# Patient Record
Sex: Male | Born: 1955 | Race: Black or African American | Hispanic: No | Marital: Married | Smoking: Never smoker
Health system: Southern US, Community
[De-identification: ages and names within clinical notes are randomized; demographics above are authoritative.]

## PROBLEM LIST (undated history)

## (undated) DIAGNOSIS — K219 Gastro-esophageal reflux disease without esophagitis: Secondary | ICD-10-CM

## (undated) DIAGNOSIS — K529 Noninfective gastroenteritis and colitis, unspecified: Secondary | ICD-10-CM

## (undated) DIAGNOSIS — K259 Gastric ulcer, unspecified as acute or chronic, without hemorrhage or perforation: Secondary | ICD-10-CM

## (undated) DIAGNOSIS — K648 Other hemorrhoids: Secondary | ICD-10-CM

## (undated) DIAGNOSIS — K269 Duodenal ulcer, unspecified as acute or chronic, without hemorrhage or perforation: Secondary | ICD-10-CM

## (undated) DIAGNOSIS — I10 Essential (primary) hypertension: Secondary | ICD-10-CM

## (undated) HISTORY — DX: Other hemorrhoids: K64.8

## (undated) HISTORY — DX: Duodenal ulcer, unspecified as acute or chronic, without hemorrhage or perforation: K26.9

## (undated) HISTORY — DX: Gastro-esophageal reflux disease without esophagitis: K21.9

## (undated) HISTORY — DX: Gastric ulcer, unspecified as acute or chronic, without hemorrhage or perforation: K25.9

## (undated) HISTORY — DX: Noninfective gastroenteritis and colitis, unspecified: K52.9

## (undated) HISTORY — PX: EYE SURGERY: SHX253

---

## 2012-10-19 DIAGNOSIS — I10 Essential (primary) hypertension: Secondary | ICD-10-CM | POA: Diagnosis present

## 2019-01-05 ENCOUNTER — Emergency Department (HOSPITAL_BASED_OUTPATIENT_CLINIC_OR_DEPARTMENT_OTHER)
Admission: EM | Admit: 2019-01-05 | Discharge: 2019-01-05 | Disposition: A | Discharge status: Home / Self Care | Payer: PRIVATE HEALTH INSURANCE | Attending: Emergency Medicine | Admitting: Emergency Medicine

## 2019-01-05 ENCOUNTER — Other Ambulatory Visit: Payer: Self-pay

## 2019-01-05 ENCOUNTER — Encounter (HOSPITAL_BASED_OUTPATIENT_CLINIC_OR_DEPARTMENT_OTHER): Payer: Self-pay

## 2019-01-05 ENCOUNTER — Emergency Department (HOSPITAL_BASED_OUTPATIENT_CLINIC_OR_DEPARTMENT_OTHER): Payer: PRIVATE HEALTH INSURANCE

## 2019-01-05 DIAGNOSIS — R0789 Other chest pain: Secondary | ICD-10-CM | POA: Insufficient documentation

## 2019-01-05 DIAGNOSIS — I10 Essential (primary) hypertension: Secondary | ICD-10-CM | POA: Diagnosis not present

## 2019-01-05 DIAGNOSIS — R05 Cough: Secondary | ICD-10-CM | POA: Diagnosis not present

## 2019-01-05 DIAGNOSIS — Z20828 Contact with and (suspected) exposure to other viral communicable diseases: Secondary | ICD-10-CM | POA: Insufficient documentation

## 2019-01-05 HISTORY — DX: Essential (primary) hypertension: I10

## 2019-01-05 NOTE — Discharge Instructions (Signed)
Take 400mg  of ibuprofen every 8hours for the next 3 days  Please call your primary care physician for follow-up

## 2019-01-05 NOTE — ED Triage Notes (Signed)
C/o right side CP and nonprod cough x 4 week-NAD-steady gait

## 2019-01-05 NOTE — ED Provider Notes (Signed)
[facility] EMERGENCY DEPARTMENT Provider Note   CSN: [id number] Arrival date & time: 01/05/19  1208     History   Chief Complaint Chief Complaint  Patient presents with  . Chest Pain    HPI Barbara CowerMorris Comins is a 63 y.o. male.     HPI 63 year old male presents to the emergency department for right-sided chest discomfort nonproductive cough for the last 4 days without significant shortness of breath.  No fevers or chills.  No known sick contacts.  No known exposure with COVID-19 or patients under investigation for COVID-19.  Denies unilateral leg swelling.  No pleuritic nature to his pain.  Denies lower extremity swelling.  No recent travel or surgery.  Pain is constant and worse with movement.  Does not remember any trauma injury or lifting which could have exacerbated his pain   Past Medical History:  Diagnosis Date  . Hypertension     There are no active problems to display for this patient.   Past Surgical History:  Procedure Laterality Date  . EYE SURGERY          Home Medications    Prior to Admission medications   Not on File    Family History No family history on file.  Social History Social History   Tobacco Use  . Smoking status: Never Smoker  . Smokeless tobacco: Never Used  Substance Use Topics  . Alcohol use: Never    Frequency: Never  . Drug use: Never     Allergies   Patient has no known allergies.   Review of Systems Review of Systems  All other systems reviewed and are negative.    Physical Exam Updated Vital Signs BP (!) 146/114 (BP Location: Right Arm)   Pulse 73   Temp 98.3 F (36.8 C) (Oral)   Resp 18   Ht 5\' 9"  (1.753 m)   Wt 81.2 kg   SpO2 99%   BMI 26.43 kg/m   Physical Exam Vitals signs and nursing note reviewed.  Constitutional:      Appearance: He is well-developed.  HENT:     Head: Normocephalic and atraumatic.  Neck:     Musculoskeletal: Normal range of motion.  Cardiovascular:     Rate  and Rhythm: Normal rate and regular rhythm.     Heart sounds: Normal heart sounds.  Pulmonary:     Effort: Pulmonary effort is normal. No respiratory distress.     Breath sounds: Normal breath sounds.  Abdominal:     General: There is no distension.     Palpations: Abdomen is soft.     Tenderness: There is no abdominal tenderness.  Musculoskeletal: Normal range of motion.  Skin:    General: Skin is warm and dry.  Neurological:     Mental Status: He is alert and oriented to person, place, and time.  Psychiatric:        Judgment: Judgment normal.      ED Treatments / Results  Labs (all labs ordered are listed, but only abnormal results are displayed) Labs Reviewed  NOVEL CORONAVIRUS, NAA (HOSPITAL ORDER, SEND-OUT TO REF LAB)    EKG EKG Interpretation  Date/Time:  Wednesday January 05 2019 12:20:51 EDT Ventricular Rate:  70 PR Interval:    QRS Duration: 96 QT Interval:  407 QTC Calculation: 440 R Axis:   77 Text Interpretation:  Sinus rhythm No old tracing to compare Confirmed by Mancel BaleWentz, Elliott [phone]([id number]) on 01/06/2019 2:16:36 PM   Radiology Dg Chest 2 View  Result Date: 01/05/2019 CLINICAL DATA:  Right-sided chest pain and cough. EXAM: CHEST - 2 VIEW COMPARISON:  None. FINDINGS: The cardiomediastinal silhouette is within normal limits. The lungs are well inflated and clear. There is no evidence of pleural effusion or pneumothorax. No acute osseous abnormality is identified. IMPRESSION: No active cardiopulmonary disease. Electronically Signed   By: Logan Bores M.D.   On: 01/05/2019 13:32    Procedures Procedures (including critical care time)  Medications Ordered in ED Medications - No data to display   Initial Impression / Assessment and Plan / ED Course  I have reviewed the triage vital signs and the nursing notes.  Pertinent labs & imaging results that were available during my care of the patient were reviewed by me and considered in my medical decision making (see  chart for details).        Atypical right-sided chest pain.  Seems musculoskeletal with tenderness of the right chest.  Chest x-ray normal.  EKG without ischemic changes.  Patient really desires testing for coronavirus.  Outpatient testing obtained.  Primary care follow-up.  Recommend anti-inflammatories.  Patient encouraged to return the emergency department for new or worsening symptoms  Final Clinical Impressions(s) / ED Diagnoses   Final diagnoses:  Atypical chest pain    ED Discharge Orders    None       Jola Schmidt, MD 01/07/19 [phone]

## 2019-01-06 LAB — NOVEL CORONAVIRUS, NAA (HOSP ORDER, SEND-OUT TO REF LAB; TAT 18-24 HRS): SARS-CoV-2, NAA: NOT DETECTED

## 2019-09-25 ENCOUNTER — Ambulatory Visit: Payer: PRIVATE HEALTH INSURANCE | Attending: Internal Medicine

## 2019-09-25 DIAGNOSIS — Z23 Encounter for immunization: Secondary | ICD-10-CM | POA: Insufficient documentation

## 2019-09-25 NOTE — Progress Notes (Signed)
   Covid-19 Vaccination Clinic  Name:  Keith Brewer    MRN: [id number] DOB: March 05, 1956  09/25/2019  [name] was observed post Covid-19 immunization for 15 minutes without incident. He was provided with Vaccine Information Sheet and instruction to access the V-Safe system.   [name] was instructed to call 911 with any severe reactions post vaccine: Marland Kitchen Difficulty breathing  . Swelling of face and throat  . A fast heartbeat  . A bad rash all over body  . Dizziness and weakness   Immunizations Administered    Name Date Dose VIS Date Route   Pfizer COVID-19 Vaccine 09/25/2019 10:11 AM 0.3 mL 07/01/2019 Intramuscular   Manufacturer: [employer], [employer]   Lot: [id number]   NDC: [address]-1

## 2019-10-25 ENCOUNTER — Ambulatory Visit: Payer: PRIVATE HEALTH INSURANCE | Attending: Internal Medicine

## 2019-10-25 DIAGNOSIS — Z23 Encounter for immunization: Secondary | ICD-10-CM

## 2019-10-25 NOTE — Progress Notes (Signed)
   Covid-19 Vaccination Clinic  Name:  Keith Brewer    MRN: [id number] DOB: 01-20-1956  10/25/2019  [name] was observed post Covid-19 immunization for 15 minutes without incident. He was provided with Vaccine Information Sheet and instruction to access the V-Safe system.   [name] was instructed to call 911 with any severe reactions post vaccine: Marland Kitchen Difficulty breathing  . Swelling of face and throat  . A fast heartbeat  . A bad rash all over body  . Dizziness and weakness   Immunizations Administered    Name Date Dose VIS Date Route   Pfizer COVID-19 Vaccine 10/25/2019 11:25 AM 0.3 mL 07/01/2019 Intramuscular   Manufacturer: [employer], [employer]   Lot: [id number]   NDC: [address]-8

## 2019-12-04 IMAGING — CR CHEST - 2 VIEW
2 series · 2 of 2 positions shown · non-contrast
Comparison: None.

CLINICAL DATA: Right-sided chest pain and cough.

EXAM:
CHEST - 2 VIEW

[w chest pa]
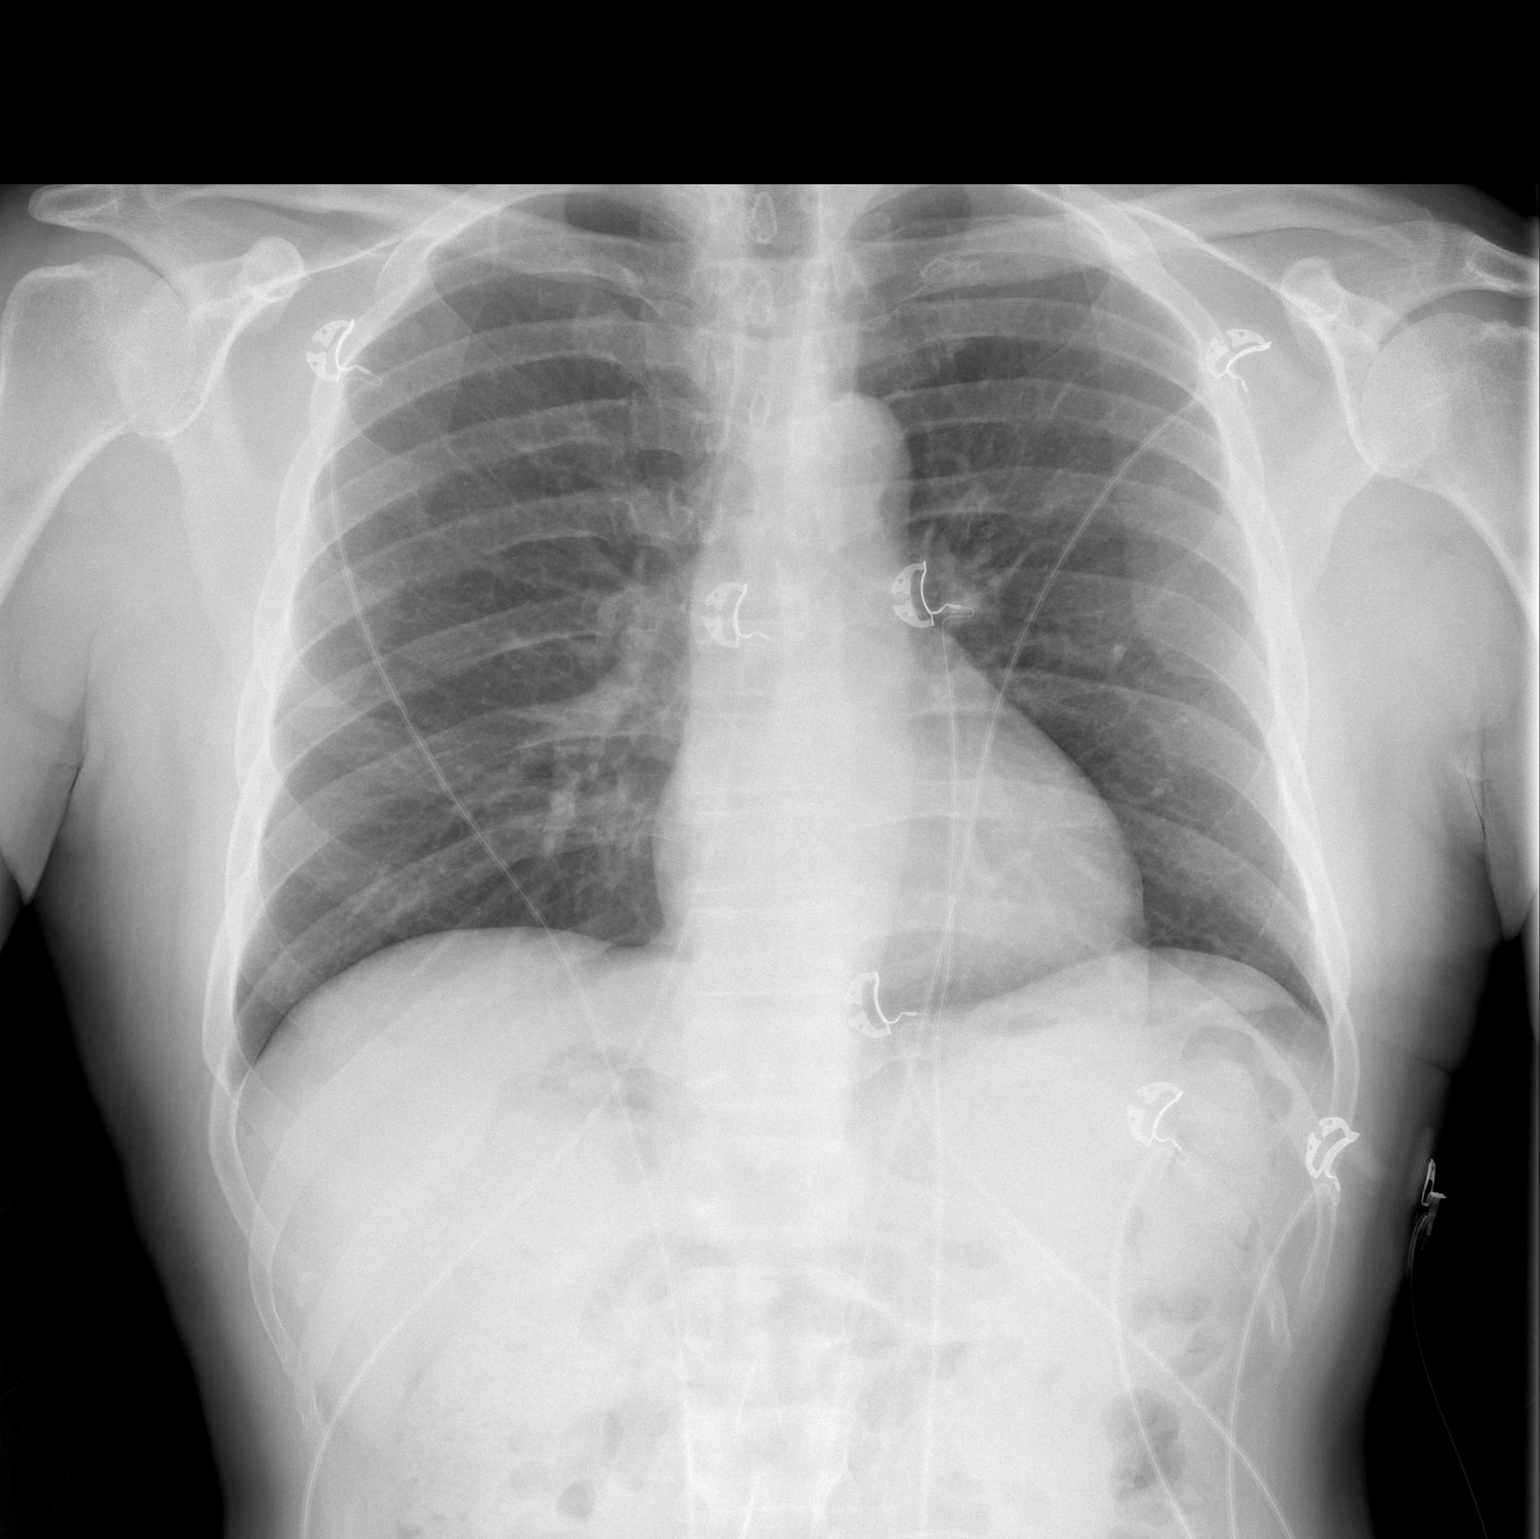

[w chest lat]
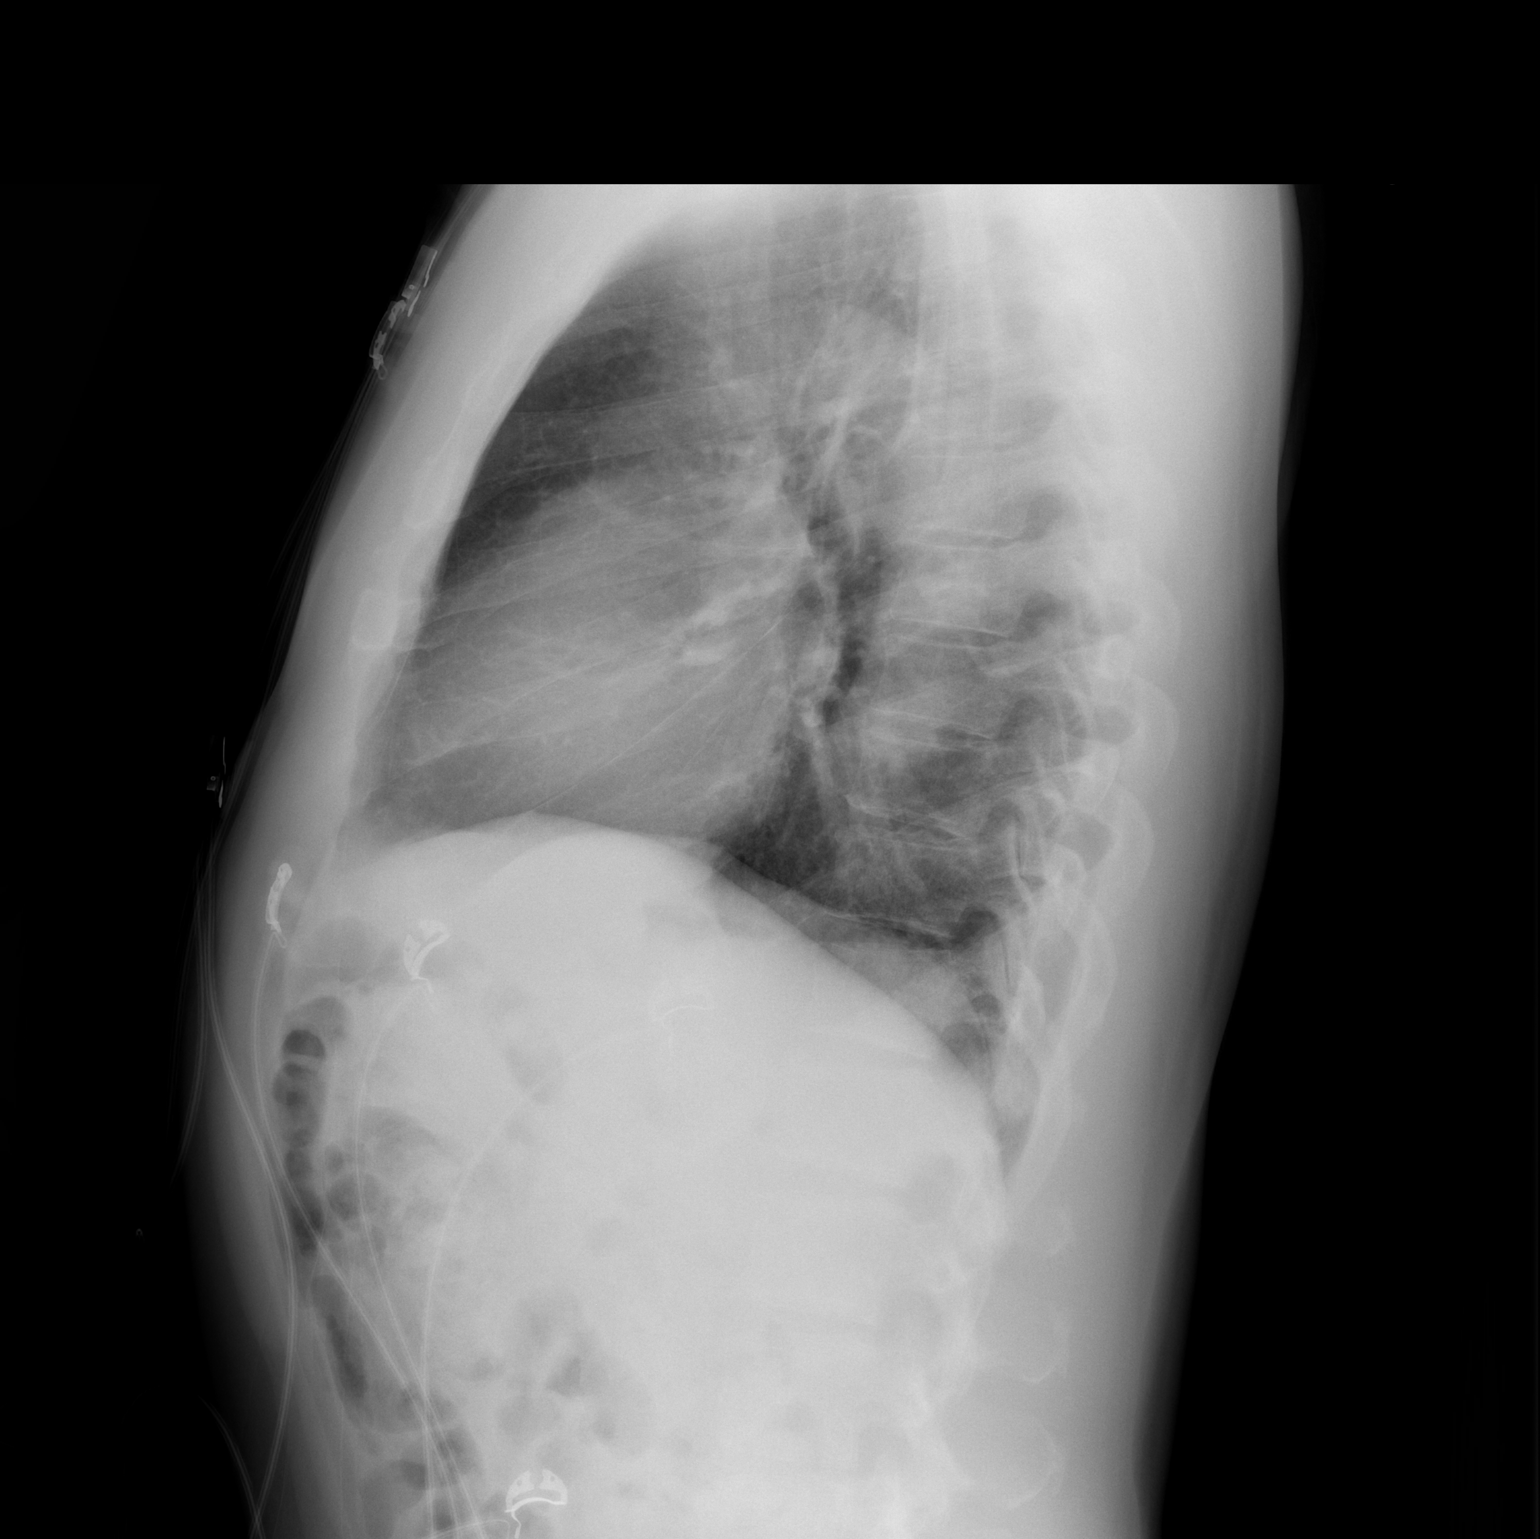

[2 of 2 positions shown; findings below may reference images not displayed]

FINDINGS: The cardiomediastinal silhouette is within normal limits. The lungs
are well inflated and clear. There is no evidence of pleural
effusion or pneumothorax. No acute osseous abnormality is
identified.
IMPRESSION: No active cardiopulmonary disease.

## 2021-07-02 DIAGNOSIS — Z8546 Personal history of malignant neoplasm of prostate: Secondary | ICD-10-CM | POA: Insufficient documentation

## 2021-08-13 ENCOUNTER — Telehealth: Payer: Self-pay | Admitting: Internal Medicine

## 2021-08-13 NOTE — Telephone Encounter (Signed)
Hey Dr. Marina Goodell,   We received a referral in our office for colonoscopy. I have records of last procedures and will send for review. As DOD, (12/19 PM), could you review and advise on scheduling?  Thank you

## 2021-08-13 NOTE — Telephone Encounter (Signed)
RECORDS REVIEWED: Available records show that the patient has undergone upper endoscopy previously for history of duodenal ulcer disease. He also underwent colonoscopy in 2006 (proctitis but otherwise normal) and November 2014 (hemorrhoids, otherwise normal).  Based on this review the patient would not be due for routine repeat screening colonoscopy until November 2024.  If there are other clinical issues, or if the patient would like to discuss further, please have him schedule an office evaluation. Thanks, Dr. Henrene Pastor

## 2021-08-16 ENCOUNTER — Encounter: Payer: Self-pay | Admitting: Internal Medicine

## 2021-08-16 NOTE — Telephone Encounter (Signed)
OV scheduled 2/20

## 2021-09-09 ENCOUNTER — Encounter: Payer: Self-pay | Admitting: Internal Medicine

## 2021-09-09 ENCOUNTER — Ambulatory Visit (INDEPENDENT_AMBULATORY_CARE_PROVIDER_SITE_OTHER): Payer: Medicare Other | Admitting: Internal Medicine

## 2021-09-09 VITALS — BP 142/80 | HR 63 | Ht 69.0 in | Wt 196.0 lb

## 2021-09-09 DIAGNOSIS — K219 Gastro-esophageal reflux disease without esophagitis: Secondary | ICD-10-CM | POA: Diagnosis not present

## 2021-09-09 DIAGNOSIS — R1319 Other dysphagia: Secondary | ICD-10-CM | POA: Diagnosis not present

## 2021-09-09 DIAGNOSIS — Z1211 Encounter for screening for malignant neoplasm of colon: Secondary | ICD-10-CM

## 2021-09-09 DIAGNOSIS — K222 Esophageal obstruction: Secondary | ICD-10-CM

## 2021-09-09 MED ORDER — SUTAB 1479-225-188 MG PO TABS: 1.0000 | ORAL_TABLET | Freq: Once | ORAL | 0 refills | Status: AC

## 2021-09-09 NOTE — Patient Instructions (Signed)
If you are age 67 or older, your body mass index should be between 23-30. Your Body mass index is 28.94 kg/m. If this is out of the aforementioned range listed, please consider follow up with your Primary Care Provider.  If you are age 75 or younger, your body mass index should be between 19-25. Your Body mass index is 28.94 kg/m. If this is out of the aformentioned range listed, please consider follow up with your Primary Care Provider.   ________________________________________________________  The [place] [facility] providers would like to encourage you to use [facility] to communicate with providers for non-urgent requests or questions.  Due to long hold times on the telephone, sending your provider a message by [facility] may be a faster and more efficient way to get a response.  Please allow 48 business hours for a response.  Please remember that this is for non-urgent requests.  _______________________________________________________  Keith Brewer have been scheduled for an endoscopy and colonoscopy. Please follow the written instructions given to you at your visit today. Please pick up your prep supplies at the pharmacy within the next 1-3 days. If you use inhalers (even only as needed), please bring them with you on the day of your procedure.

## 2021-09-09 NOTE — Progress Notes (Signed)
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  He refers himself today regarding GERD, dysphagia, issues with breathing while sleeping in a chair, and the desire to have follow-up colonoscopy.  Prior GI records from [place] have been reviewed.  The patient underwent colonoscopy June 20, 2005 for routine screening.  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  No polyps.  More recent colonoscopy June 03, 2013.  The bowel prep was said to be good.  No polyps.  At that same time he underwent upper endoscopy.  He was found to have reflux esophagitis.  He does have a history of gastric and duodenal ulcers.  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  Patient tells me that he does have occasional reflux symptoms for which she takes Tums.  He also reports some dysphagia items such as peanuts.  No problems with meats or breads.  He tells me that his wife notices that when he is sleeping in a chair he stops breathing.  This is followed by gasping for breath.  No coughing or choking episodes.  Finally, he is interested in repeat screening colonoscopy.  I reviewed with him the current guidelines.  He denies any symptoms but "just wants to be safe".  No family history of colon cancer.  Outside blood work from 2 months ago shows unremarkable comprehensive metabolic panel with normal liver tests.  Normal B12 and TSH.  Unremarkable CBC with hemoglobin 15.0.  No relevant x-ray studies.  REVIEW OF SYSTEMS:  All non-GI ROS negative unless otherwise stated in the HPI.  Past Medical History:  Diagnosis Date   Colitis    Duodenal ulcer    Gastric ulcer    GERD (gastroesophageal reflux disease)    Hypertension    Internal hemorrhoids     Past Surgical History:  Procedure Laterality Date   EYE SURGERY       Social History Yegor Bech  reports that he has never smoked. He has never used smokeless tobacco. He reports that he does not drink alcohol and does not use drugs.  family history includes Diabetes in his father; Kidney disease in his father.  No Known Allergies     PHYSICAL EXAMINATION: Vital signs: BP (!) 142/80    Pulse 63    Ht 5\' 9"  (1.753 m)    Wt 196 lb (88.9 kg)    SpO2 97%    BMI 28.94 kg/m   Constitutional: generally well-appearing, no acute distress Psychiatric: alert and oriented x3, cooperative Eyes: extraocular movements intact, anicteric, conjunctiva pink Mouth: oral pharynx moist, no lesions Neck: supple no lymphadenopathy Cardiovascular: heart regular rate and rhythm, no murmur Lungs: clear to auscultation bilaterally Abdomen: soft, nontender, nondistended, no obvious ascites, no peritoneal signs, normal bowel sounds, no organomegaly Rectal: Deferred until colonoscopy Extremities: no clubbing, cyanosis, or lower extremity edema bilaterally Skin: no lesions on visible extremities Neuro: No focal deficits.  Cranial nerves intact  ASSESSMENT:  1.  GERD.  Uses on-demand antacids. 2.  Mild dysphagia. 3.  History of esophagitis and esophageal web/stricture. 4.  Colonoscopy 2006 with "proctitis" but negative for neoplasia.  Follow-up exam 2014 negative for neoplasia.  The patient does not want to wait until his 10-year anniversary.  No lower GI complaints.  No family history of colon cancer 5.  Apneic episodes  PLAN:  1.  Reflux precautions 2.  Schedule upper endoscopy with possible esophageal dilation.The nature of the procedure, as well as the risks, benefits, and alternatives were carefully and thoroughly reviewed with the patient. Ample time for discussion and questions allowed. The patient understood, was satisfied, and agreed to proceed.  3.  Discussed with PCP what sounds like apneic episodes 4.  Schedule screening colonoscopy.The nature of the  procedure, as well as the risks, benefits, and alternatives were carefully and thoroughly reviewed with the patient. Ample time for discussion and questions allowed. The patient understood, was satisfied, and agreed to proceed.

## 2021-10-23 ENCOUNTER — Encounter: Payer: Medicare Other | Admitting: Internal Medicine

## 2021-10-23 ENCOUNTER — Telehealth: Payer: Self-pay | Admitting: Internal Medicine

## 2021-10-23 NOTE — Telephone Encounter (Signed)
Good Afternoon Dr. Marina Goodell,  ? ?I called this patient at 12:45 pm today and he stated that he forgot and never received a reminder call.  He stated that he would call back to reschedule.  ? ?I will NO SHOW him. ?

## 2023-01-27 ENCOUNTER — Other Ambulatory Visit: Payer: Self-pay

## 2023-01-27 ENCOUNTER — Emergency Department (HOSPITAL_BASED_OUTPATIENT_CLINIC_OR_DEPARTMENT_OTHER): Payer: Medicare PPO

## 2023-01-27 ENCOUNTER — Observation Stay (HOSPITAL_COMMUNITY): Payer: Medicare PPO

## 2023-01-27 ENCOUNTER — Observation Stay (HOSPITAL_BASED_OUTPATIENT_CLINIC_OR_DEPARTMENT_OTHER)
Admission: EM | Admit: 2023-01-27 | Discharge: 2023-01-28 | Disposition: A | Discharge status: Home / Self Care | Payer: Medicare PPO | Attending: Internal Medicine | Admitting: Internal Medicine

## 2023-01-27 ENCOUNTER — Encounter (HOSPITAL_BASED_OUTPATIENT_CLINIC_OR_DEPARTMENT_OTHER): Payer: Self-pay | Admitting: Pediatrics

## 2023-01-27 DIAGNOSIS — R4701 Aphasia: Secondary | ICD-10-CM | POA: Diagnosis present

## 2023-01-27 DIAGNOSIS — Z79899 Other long term (current) drug therapy: Secondary | ICD-10-CM | POA: Insufficient documentation

## 2023-01-27 DIAGNOSIS — Z8719 Personal history of other diseases of the digestive system: Secondary | ICD-10-CM

## 2023-01-27 DIAGNOSIS — I10 Essential (primary) hypertension: Secondary | ICD-10-CM | POA: Insufficient documentation

## 2023-01-27 DIAGNOSIS — G459 Transient cerebral ischemic attack, unspecified: Secondary | ICD-10-CM | POA: Diagnosis not present

## 2023-01-27 DIAGNOSIS — E785 Hyperlipidemia, unspecified: Secondary | ICD-10-CM | POA: Insufficient documentation

## 2023-01-27 LAB — DIFFERENTIAL
Abs Immature Granulocytes: 0.01 10*3/uL (ref 0.00–0.07)
Basophils Absolute: 0 10*3/uL (ref 0.0–0.1)
Basophils Relative: 1 %
Eosinophils Absolute: 0 10*3/uL (ref 0.0–0.5)
Eosinophils Relative: 1 %
Immature Granulocytes: 0 %
Lymphocytes Relative: 39 %
Lymphs Abs: 1.4 10*3/uL (ref 0.7–4.0)
Monocytes Absolute: 0.4 10*3/uL (ref 0.1–1.0)
Monocytes Relative: 12 %
Neutro Abs: 1.7 10*3/uL (ref 1.7–7.7)
Neutrophils Relative %: 47 %

## 2023-01-27 LAB — CBC
HCT: 43.3 % (ref 39.0–52.0)
Hemoglobin: 15.2 g/dL (ref 13.0–17.0)
MCH: 30.1 pg (ref 26.0–34.0)
MCHC: 35.1 g/dL (ref 30.0–36.0)
MCV: 85.7 fL (ref 80.0–100.0)
Platelets: 197 10*3/uL (ref 150–400)
RBC: 5.05 MIL/uL (ref 4.22–5.81)
RDW: 13.1 % (ref 11.5–15.5)
WBC: 3.5 10*3/uL — ABNORMAL LOW (ref 4.0–10.5)
nRBC: 0 % (ref 0.0–0.2)

## 2023-01-27 LAB — COMPREHENSIVE METABOLIC PANEL
ALT: 37 U/L (ref 0–44)
AST: 35 U/L (ref 15–41)
Albumin: 4.3 g/dL (ref 3.5–5.0)
Alkaline Phosphatase: 64 U/L (ref 38–126)
Anion gap: 11 (ref 5–15)
BUN: 13 mg/dL (ref 8–23)
CO2: 24 mmol/L (ref 22–32)
Calcium: 9.1 mg/dL (ref 8.9–10.3)
Chloride: 100 mmol/L (ref 98–111)
Creatinine, Ser: 1.15 mg/dL (ref 0.61–1.24)
GFR, Estimated: >60 mL/min (ref >60)
Glucose, Bld: 110 mg/dL — ABNORMAL HIGH (ref 70–99)
Potassium: 3.6 mmol/L (ref 3.5–5.1)
Sodium: 135 mmol/L (ref 135–145)
Total Bilirubin: 0.8 mg/dL (ref 0.3–1.2)
Total Protein: 7.4 g/dL (ref 6.5–8.1)

## 2023-01-27 LAB — RAPID URINE DRUG SCREEN, HOSP PERFORMED
Amphetamines: NOT DETECTED
Barbiturates: NOT DETECTED
Benzodiazepines: NOT DETECTED
Cocaine: NOT DETECTED
Opiates: NOT DETECTED
Tetrahydrocannabinol: NOT DETECTED

## 2023-01-27 LAB — URINALYSIS, ROUTINE W REFLEX MICROSCOPIC
Bilirubin Urine: NEGATIVE
Glucose, UA: NEGATIVE mg/dL
Hgb urine dipstick: NEGATIVE
Ketones, ur: NEGATIVE mg/dL
Leukocytes,Ua: NEGATIVE
Nitrite: NEGATIVE
Protein, ur: NEGATIVE mg/dL
Specific Gravity, Urine: 1.01 (ref 1.005–1.030)
pH: 5.5 (ref 5.0–8.0)

## 2023-01-27 LAB — LIPID PANEL
Cholesterol: 233 mg/dL — ABNORMAL HIGH (ref 0–200)
HDL: 64 mg/dL (ref >40)
LDL Cholesterol: 142 mg/dL — ABNORMAL HIGH (ref 0–99)
Total CHOL/HDL Ratio: 3.6 RATIO
Triglycerides: 133 mg/dL (ref <150)
VLDL: 27 mg/dL (ref 0–40)

## 2023-01-27 LAB — HEMOGLOBIN A1C
Hgb A1c MFr Bld: 5.9 % — ABNORMAL HIGH (ref 4.8–5.6)
Mean Plasma Glucose: 122.63 mg/dL

## 2023-01-27 LAB — APTT: aPTT: 27 seconds (ref 24–36)

## 2023-01-27 LAB — TSH: TSH: 1.991 u[IU]/mL (ref 0.350–4.500)

## 2023-01-27 LAB — PROTIME-INR
INR: 1 (ref 0.8–1.2)
Prothrombin Time: 13.6 seconds (ref 11.4–15.2)

## 2023-01-27 LAB — ETHANOL: Alcohol, Ethyl (B): <10 mg/dL (ref <10)

## 2023-01-27 LAB — HIV ANTIBODY (ROUTINE TESTING W REFLEX): HIV Screen 4th Generation wRfx: NONREACTIVE

## 2023-01-27 LAB — CBG MONITORING, ED: Glucose-Capillary: 109 mg/dL — ABNORMAL HIGH (ref 70–99)

## 2023-01-27 MED ORDER — ATORVASTATIN CALCIUM 40 MG PO TABS
40.0000 mg | ORAL_TABLET | Freq: Every day | ORAL | Status: DC
  Administered 2023-01-27 – 2023-01-28 (×2): 40 mg via ORAL
  Filled 2023-01-27 (×2): qty 1

## 2023-01-27 MED ORDER — ENOXAPARIN SODIUM 40 MG/0.4ML IJ SOSY
40.0000 mg | PREFILLED_SYRINGE | INTRAMUSCULAR | Status: DC
  Administered 2023-01-27: 40 mg via SUBCUTANEOUS
  Filled 2023-01-27: qty 0.4

## 2023-01-27 MED ORDER — PANTOPRAZOLE SODIUM 40 MG PO TBEC
40.0000 mg | DELAYED_RELEASE_TABLET | Freq: Every day | ORAL | Status: DC
  Administered 2023-01-27 – 2023-01-28 (×2): 40 mg via ORAL
  Filled 2023-01-27 (×2): qty 1

## 2023-01-27 MED ORDER — ASPIRIN 81 MG PO TBEC
81.0000 mg | DELAYED_RELEASE_TABLET | Freq: Once | ORAL | Status: AC
  Administered 2023-01-27: 81 mg via ORAL
  Filled 2023-01-27: qty 1

## 2023-01-27 MED ORDER — IOHEXOL 350 MG/ML SOLN
100.0000 mL | Freq: Once | INTRAVENOUS | Status: AC | PRN
  Administered 2023-01-27: 80 mL via INTRAVENOUS

## 2023-01-27 MED ORDER — ASPIRIN 81 MG PO TBEC
81.0000 mg | DELAYED_RELEASE_TABLET | Freq: Every day | ORAL | Status: DC
  Administered 2023-01-28: 81 mg via ORAL
  Filled 2023-01-27: qty 1

## 2023-01-27 MED ORDER — ACETAMINOPHEN 650 MG RE SUPP: 650.0000 mg | RECTAL | Status: DC | PRN

## 2023-01-27 MED ORDER — CLOPIDOGREL BISULFATE 75 MG PO TABS
75.0000 mg | ORAL_TABLET | Freq: Every day | ORAL | Status: DC
  Administered 2023-01-28: 75 mg via ORAL
  Filled 2023-01-27: qty 1

## 2023-01-27 MED ORDER — STROKE: EARLY STAGES OF RECOVERY BOOK
Freq: Once | Status: AC
  Filled 2023-01-27: qty 1

## 2023-01-27 MED ORDER — CLOPIDOGREL BISULFATE 300 MG PO TABS
300.0000 mg | ORAL_TABLET | Freq: Once | ORAL | Status: AC
  Administered 2023-01-27: 300 mg via ORAL
  Filled 2023-01-27: qty 1

## 2023-01-27 MED ORDER — SODIUM CHLORIDE 0.9 % IV SOLN: INTRAVENOUS | Status: DC

## 2023-01-27 MED ORDER — SENNOSIDES-DOCUSATE SODIUM 8.6-50 MG PO TABS: 1.0000 | ORAL_TABLET | Freq: Every evening | ORAL | Status: DC | PRN

## 2023-01-27 MED ORDER — ACETAMINOPHEN 160 MG/5ML PO SOLN: 650.0000 mg | ORAL | Status: DC | PRN

## 2023-01-27 MED ORDER — ACETAMINOPHEN 325 MG PO TABS: 650.0000 mg | ORAL_TABLET | ORAL | Status: DC | PRN

## 2023-01-27 NOTE — H&P (Signed)
History and Physical    Patient: Keith Brewer YQM:[id number] DOB: 04-02-1956 DOA: 01/27/2023 DOS: the patient was seen and examined on 01/27/2023 PCP: Kallie Locks, FNP  Patient coming from: Home - lives with wife and her mother (actually lives in [place], staying with her here for now); NOK: Wife, Romero Letizia, [phone]   Chief Complaint: Aphasia  HPI: Keith Brewer is a 67 y.o. male with medical history significant of colitis, gastric and duodenal ulcers, and HTN presenting with aphasia. He reports that he went ot bed last night about 1130.  He awoke this AM and was noticing some breathing problems from his nose.  He was trying to talk to his wife and it was slurred.  Left side of his mouth was drooping.  He took 2 ibuprofen and then spilled all of his medication on the floor, clumsy but not clearly weak.  He was able to shave and shower without difficulty and seemed to be better after.  After 30 minutes, he still had a mild droop but was able to speak normally.  Droop resolved about 15 minutes later.  He feels well now.  [place] over[place] the beach.      ER Course:  [facility] transfer, per Dr. Alinda Money:  TIA.  Patient presented after 1 hour of slowed and slurred speech, left facial droop, clumsiness of the hands.  Neurology consulted, patient given aspirin and Plavix, recommending TIA workup.      Review of Systems: As mentioned in the history of present illness. All other systems reviewed and are negative. Past Medical History:  Diagnosis Date   Colitis    Duodenal ulcer    Gastric ulcer    GERD (gastroesophageal reflux disease)    Hypertension    Internal hemorrhoids    Past Surgical History:  Procedure Laterality Date   EYE SURGERY     Social History:  reports that he has never smoked. He has never used smokeless tobacco. He reports that he does not currently use alcohol. He reports that he does not currently use drugs after having used the following  drugs: Marijuana.  No Known Allergies  Family History  Problem Relation Age of Onset   Stroke Mother    Diabetes Father    Kidney disease Father    Stomach cancer Neg Hx    Colon cancer Neg Hx    Esophageal cancer Neg Hx    Pancreatic cancer Neg Hx     Prior to Admission medications   Medication Sig Start Date End Date Taking? Authorizing Provider  amLODipine (NORVASC) 10 MG tablet Take 10 mg by mouth daily. 07/01/21   [provider]  meclizine (ANTIVERT) 25 MG tablet Take by mouth. 07/01/21   [provider]  Misc Natural Products (COMPLETE PROSTATE HEALTH) TABS Take 2 capsules by mouth daily. 07/01/21   [provider]  Multiple Vitamin (MULTI-VITAMIN) tablet Take 1 tablet by mouth daily.    [provider]  Omega-3 1000 MG CAPS Take by mouth.    [provider]    Physical Exam: Vitals:   01/27/23 1500 01/27/23 1530 01/27/23 1610 01/27/23 1700  BP: (!) 149/74 (!) 155/91 (!) 165/91 (!) 142/88  Pulse: 71 78 70 64  Resp: 14 14 13 15   Temp:    97.9 F (36.6 C)  TempSrc:    Oral  SpO2: 98% 100% 98% 99%  Weight:      Height:       General:  Appears calm and comfortable and is in NAD Eyes:   EOMI, normal lids, iris ENT:  grossly normal hearing, lips & tongue, mmm Neck:  no LAD, masses or thyromegaly Cardiovascular:  RRR, no m/r/g. No LE edema.  Respiratory:   CTA bilaterally with no wheezes/rales/rhonchi.  Normal respiratory effort. Abdomen:  soft, NT, ND Skin:  no rash or induration seen on limited exam Musculoskeletal:  grossly normal tone BUE/BLE, good ROM, no bony abnormality Psychiatric:  grossly normal mood and affect, speech fluent and appropriate, AOx3 Neurologic:  CN 2-12 grossly intact, moves all extremities in coordinated fashion, sensation intact   Radiological Exams on Admission: Independently reviewed - see discussion in A/P where applicable  CT ANGIO HEAD NECK W WO CM  Result Date: 01/27/2023 CLINICAL DATA:   Slurred speech EXAM: CT ANGIOGRAPHY HEAD AND NECK WITH AND WITHOUT CONTRAST TECHNIQUE: Multidetector CT imaging of the head and neck was performed using the standard protocol during bolus administration of intravenous contrast. Multiplanar CT image reconstructions and MIPs were obtained to evaluate the vascular anatomy. Carotid stenosis measurements (when applicable) are obtained utilizing NASCET criteria, using the distal internal carotid diameter as the denominator. RADIATION DOSE REDUCTION: This exam was performed according to the departmental dose-optimization program which includes automated exposure control, adjustment of the mA and/or kV according to patient size and/or use of iterative reconstruction technique. CONTRAST:  80mL OMNIPAQUE IOHEXOL 350 MG/ML SOLN COMPARISON:  None Available. FINDINGS: CT HEAD FINDINGS Brain: No evidence of acute infarction, hemorrhage, hydrocephalus, extra-axial collection or mass lesion/mass effect. Sequela of mild chronic microvascular ischemic change. Vascular: See below Skull: Normal. Negative for fracture or focal lesion. Sinuses/Orbits: No middle ear or mastoid effusion. Paranasal sinuses are clear. Orbits are unremarkable. Other: None. Review of the MIP images confirms the above findings CTA NECK FINDINGS Aortic arch: Standard branching. Imaged portion shows no evidence of aneurysm or dissection. No significant stenosis of the major arch vessel origins. Right carotid system: No evidence of dissection, stenosis (50% or greater), or occlusion. Left carotid system: No evidence of dissection, stenosis (50% or greater), or occlusion. Vertebral arteries: Codominant. No evidence of dissection, stenosis (50% or greater), or occlusion. Skeleton: Negative. Other neck: Negative. Upper chest: Negative. Review of the MIP images confirms the above findings CTA HEAD FINDINGS Anterior circulation: No significant stenosis, proximal occlusion, aneurysm, or vascular malformation. Posterior  circulation: No significant stenosis, proximal occlusion, aneurysm, or vascular malformation. Venous sinuses: As permitted by contrast timing, patent. Anatomic variants: None Review of the MIP images confirms the above findings IMPRESSION: 1. No hemorrhage or CT evidence of an acute cortical infarct 2. No intracranial large vessel occlusion or significant stenosis. 3. No hemodynamically significant stenosis in the neck. Electronically Signed   By: Lorenza Cambridge M.D.   On: 01/27/2023 11:24    EKG: Independently reviewed.  NSR with rate 62; nonspecific ST changes with no evidence of acute ischemia   Labs on Admission: I have personally reviewed the available labs and imaging studies at the time of the admission.  Pertinent labs:    Glucose 110 WBC 3.5 Normal UA Negative UDS ETOH <10   Assessment and Plan: Principal Problem:   TIA (transient ischemic attack) Active Problems:   Essential hypertension   History of gastrointestinal ulcer    TIA -Patient had transient episode of L facial droop and aphasia, symptoms have resolved completely -Concerning for TIA -ABCD2 score is 4 -Aspirin has been given to reduce stroke mortality and decrease morbidity -Will place in observation status  for CVA/TIA evaluation -Telemetry monitoring -MRI -Echo -Risk stratification with FLP, A1c; will also check TSH and UDS -Patient will need DAPT for 21 days when ABCD2 score is at least 4 and NIH score is 3 or less, and then can transition to monotherapy with a single antiplatelet agent.  He has been loaded with Plavix and will start DAPT in the AM. -Neurology consult -PT/OT/ST/Nutrition Consults  HTN -Allow permissive HTN for now -Treat BP only if >220/120, and then with goal of 15% reduction -Hold amlodipine and plan to restart in 48-72 hours   HLD -Check FLP -Start empiric Lipitor 40 mg daily   H/o ulcers -Will start empiric PPI given need for DAPT and indefinite ASA      Advance Care  Planning:   Code Status: Full Code - Code status was discussed with the patient and/or family at the time of admission.  The patient would want to receive full resuscitative measures at this time.   Consults: Neurology; PT/OT/SLP; nutrition  DVT Prophylaxis: Lovenox  Family Communication: None present; wife was on the phone for the majority of the encounter  Severity of Illness: The appropriate patient status for this patient is OBSERVATION. Observation status is judged to be reasonable and necessary in order to provide the required intensity of service to ensure the patient's safety. The patient's presenting symptoms, physical exam findings, and initial radiographic and laboratory data in the context of their medical condition is felt to place them at decreased risk for further clinical deterioration. Furthermore, it is anticipated that the patient will be medically stable for discharge from the hospital within 2 midnights of admission.   Author: Jonah Blue, MD 01/27/2023 6:19 PM  For on call review [email or web address].

## 2023-01-27 NOTE — ED Notes (Signed)
Reports symptoms have resolved had slurred speech and unsteadiness and was dropping things.

## 2023-01-27 NOTE — Progress Notes (Signed)
Patient admitted to [facility] room 306, per MD order. Report received by ED RN and chart review. VSS. Respirations are even and unlabored. RA. NAD. Patient denies CP or SOB. PIV intact. Telemetry/cardiac monitor applied, per order. NSR. Patient oriented to room, patient verbalized understanding and in agreement. Safety precautions in place. Call bell within reach. Bed in lowest position and locked.

## 2023-01-27 NOTE — ED Notes (Signed)
ED TO INPATIENT HANDOFF REPORT  ED Nurse Name and Phone #: [phone]  S Name/Age/Gender Keith Brewer 67 y.o. male Room/Bed: MH04/MH04  Code Status   Code Status: Not on file  Home/SNF/Other Home Patient oriented to: self, place, time, and situation Is this baseline? Yes   Triage Complete: Triage complete  Chief Complaint TIA (transient ischemic attack) [G45.9]  Triage Note Reports slurred speech upon waking around 0730 this morning. Endorsed some right sided mouth droop as well. Stated the symptoms lasted about an hour and he sound "normal" now. States he took some motrin and BP meds PTA. Stated he went to bed around 11 pm and no symptoms then.    Allergies No Known Allergies  Level of Care/Admitting Diagnosis ED Disposition     ED Disposition  Admit   Condition  --   Comment  Hospital Area: [facility] [[id number]]  Level of Care: Telemetry Medical [104]  Interfacility transfer: Yes  May place patient in observation at [facility] or [facility] if equivalent level of care is available:: No  Covid Evaluation: Asymptomatic - no recent exposure (last 10 days) testing not required  Diagnosis: TIA (transient ischemic attack) [454098]  Admitting Physician: Synetta Fail [[id number]]  Attending Physician: Synetta Fail [[id number]]          B Medical/Surgery History Past Medical History:  Diagnosis Date   Colitis    Duodenal ulcer    Gastric ulcer    GERD (gastroesophageal reflux disease)    Hypertension    Internal hemorrhoids    Past Surgical History:  Procedure Laterality Date   EYE SURGERY       A IV Location/Drains/Wounds Patient Lines/Drains/Airways Status     Active Line/Drains/Airways     Name Placement date Placement time Site Days   Peripheral IV 01/27/23 18 G Left Antecubital 01/27/23  0955  Antecubital  less than 1            Intake/Output Last 24 hours No intake or output data in the 24 hours ending 01/27/23  1432  Labs/Imaging Results for orders placed or performed during the hospital encounter of 01/27/23 (from the past 48 hour(s))  CBG monitoring, ED     Status: Abnormal   Collection Time: 01/27/23  9:33 AM  Result Value Ref Range   Glucose-Capillary 109 (H) 70 - 99 mg/dL    Comment: Glucose reference range applies only to samples taken after fasting for at least 8 hours.  Urine rapid drug screen (hosp performed)     Status: None   Collection Time: 01/27/23  9:48 AM  Result Value Ref Range   Opiates NONE DETECTED NONE DETECTED   Cocaine NONE DETECTED NONE DETECTED   Benzodiazepines NONE DETECTED NONE DETECTED   Amphetamines NONE DETECTED NONE DETECTED   Tetrahydrocannabinol NONE DETECTED NONE DETECTED   Barbiturates NONE DETECTED NONE DETECTED    Comment: (NOTE) DRUG SCREEN FOR MEDICAL PURPOSES ONLY.  IF CONFIRMATION IS NEEDED FOR ANY PURPOSE, NOTIFY LAB WITHIN 5 DAYS.  LOWEST DETECTABLE LIMITS FOR URINE DRUG SCREEN Drug Class                     Cutoff (ng/mL) Amphetamine and metabolites    1000 Barbiturate and metabolites    200 Benzodiazepine                 200 Opiates and metabolites        300 Cocaine and metabolites  300 THC                            50 Performed at [facility], [address] [place], [place] [address]   Urinalysis, Routine w reflex microscopic -Urine, Clean Catch     Status: None   Collection Time: 01/27/23  9:48 AM  Result Value Ref Range   Color, Urine YELLOW YELLOW   APPearance CLEAR CLEAR   Specific Gravity, Urine 1.010 1.005 - 1.030   pH 5.5 5.0 - 8.0   Glucose, UA NEGATIVE NEGATIVE mg/dL   Hgb urine dipstick NEGATIVE NEGATIVE   Bilirubin Urine NEGATIVE NEGATIVE   Ketones, ur NEGATIVE NEGATIVE mg/dL   Protein, ur NEGATIVE NEGATIVE mg/dL   Nitrite NEGATIVE NEGATIVE   Leukocytes,Ua NEGATIVE NEGATIVE    Comment: Microscopic not done on urines with negative protein, blood, leukocytes, nitrite, or glucose < 500  mg/dL. Performed at [facility], [address] [place], [place] [address]   Ethanol     Status: None   Collection Time: 01/27/23  9:55 AM  Result Value Ref Range   Alcohol, Ethyl (B) <10 <10 mg/dL    Comment: (NOTE) Lowest detectable limit for serum alcohol is 10 mg/dL.  For medical purposes only. Performed at [facility], [address] [place], [place] [address]   Protime-INR     Status: None   Collection Time: 01/27/23  9:55 AM  Result Value Ref Range   Prothrombin Time 13.6 11.4 - 15.2 seconds   INR 1.0 0.8 - 1.2    Comment: (NOTE) INR goal varies based on device and disease states. Performed at [facility], [address] [place], [place] [address]   APTT     Status: None   Collection Time: 01/27/23  9:55 AM  Result Value Ref Range   aPTT 27 24 - 36 seconds    Comment: Performed at [facility], [address] [place], [place] [address]  CBC     Status: Abnormal   Collection Time: 01/27/23  9:55 AM  Result Value Ref Range   WBC 3.5 (L) 4.0 - 10.5 K/uL   RBC 5.05 4.22 - 5.81 MIL/uL   Hemoglobin 15.2 13.0 - 17.0 g/dL   HCT 13.0 86.5 - 78.4 %   MCV 85.7 80.0 - 100.0 fL   MCH 30.1 26.0 - 34.0 pg   MCHC 35.1 30.0 - 36.0 g/dL   RDW 69.6 29.5 - 28.4 %   Platelets 197 150 - 400 K/uL   nRBC 0.0 0.0 - 0.2 %    Comment: Performed at [facility], [address] [place], [place] [address]  Differential     Status: None   Collection Time: 01/27/23  9:55 AM  Result Value Ref Range   Neutrophils Relative % 47 %   Neutro Abs 1.7 1.7 - 7.7 K/uL   Lymphocytes Relative 39 %   Lymphs Abs 1.4 0.7 - 4.0 K/uL   Monocytes Relative 12 %   Monocytes Absolute 0.4 0.1 - 1.0 K/uL   Eosinophils Relative 1 %   Eosinophils Absolute 0.0 0.0 - 0.5 K/uL   Basophils Relative 1 %   Basophils Absolute 0.0 0.0 - 0.1 K/uL   Immature Granulocytes 0 %   Abs Immature Granulocytes 0.01 0.00 - 0.07 K/uL    Comment: Performed at [facility],  [address] [place], [place] [address]  Comprehensive metabolic panel     Status: Abnormal   Collection Time: 01/27/23  9:55 AM  Result Value Ref Range   Sodium 135 135 - 145 mmol/L   Potassium 3.6 3.5 - 5.1 mmol/L   Chloride 100 98 - 111 mmol/L   CO2 24 22 - 32 mmol/L   Glucose, Bld 110 (H) 70 - 99 mg/dL    Comment: Glucose reference range applies only to samples taken after fasting for at least 8 hours.   BUN 13 8 - 23 mg/dL   Creatinine, Ser 6.04 0.61 - 1.24 mg/dL   Calcium 9.1 8.9 - 54.0 mg/dL   Total Protein 7.4 6.5 - 8.1 g/dL   Albumin 4.3 3.5 - 5.0 g/dL   AST 35 15 - 41 U/L   ALT 37 0 - 44 U/L   Alkaline Phosphatase 64 38 - 126 U/L   Total Bilirubin 0.8 0.3 - 1.2 mg/dL   GFR, Estimated >98 >11 mL/min    Comment: (NOTE) Calculated using the CKD-EPI Creatinine Equation (2021)    Anion gap 11 5 - 15    Comment: Performed at [facility], [address] [place], [place] [address]   CT ANGIO HEAD NECK W WO CM  Result Date: 01/27/2023 CLINICAL DATA:  Slurred speech EXAM: CT ANGIOGRAPHY HEAD AND NECK WITH AND WITHOUT CONTRAST TECHNIQUE: Multidetector CT imaging of the head and neck was performed using the standard protocol during bolus administration of intravenous contrast. Multiplanar CT image reconstructions and MIPs were obtained to evaluate the vascular anatomy. Carotid stenosis measurements (when applicable) are obtained utilizing NASCET criteria, using the distal internal carotid diameter as the denominator. RADIATION DOSE REDUCTION: This exam was performed according to the departmental dose-optimization program which includes automated exposure control, adjustment of the mA and/or kV according to patient size and/or use of iterative reconstruction technique. CONTRAST:  80mL OMNIPAQUE IOHEXOL 350 MG/ML SOLN COMPARISON:  None Available. FINDINGS: CT HEAD FINDINGS Brain: No evidence of acute infarction, hemorrhage, hydrocephalus, extra-axial  collection or mass lesion/mass effect. Sequela of mild chronic microvascular ischemic change. Vascular: See below Skull: Normal. Negative for fracture or focal lesion. Sinuses/Orbits: No middle ear or mastoid effusion. Paranasal sinuses are clear. Orbits are unremarkable. Other: None. Review of the MIP images confirms the above findings CTA NECK FINDINGS Aortic arch: Standard branching. Imaged portion shows no evidence of aneurysm or dissection. No significant stenosis of the major arch vessel origins. Right carotid system: No evidence of dissection, stenosis (50% or greater), or occlusion. Left carotid system: No evidence of dissection, stenosis (50% or greater), or occlusion. Vertebral arteries: Codominant. No evidence of dissection, stenosis (50% or greater), or occlusion. Skeleton: Negative. Other neck: Negative. Upper chest: Negative. Review of the MIP images confirms the above findings CTA HEAD FINDINGS Anterior circulation: No significant stenosis, proximal occlusion, aneurysm, or vascular malformation. Posterior circulation: No significant stenosis, proximal occlusion, aneurysm, or vascular malformation. Venous sinuses: As permitted by contrast timing, patent. Anatomic variants: None Review of the MIP images confirms the above findings IMPRESSION: 1. No hemorrhage or CT evidence of an acute cortical infarct 2. No intracranial large vessel occlusion or significant stenosis. 3. No hemodynamically significant stenosis in the neck. Electronically Signed   By: Lorenza Cambridge M.D.   On: 01/27/2023 11:24    Pending Labs Unresulted Labs (From admission, onward)    None       Vitals/Pain Today's Vitals   01/27/23 0928 01/27/23  1015 01/27/23 1130 01/27/23 1300  BP:  135/87 (!) 162/99 (!) 149/74  Pulse:  (!) 59 63 65  Resp:  16 20 20   Temp:    97.8 F (36.6 C)  TempSrc:      SpO2:  97% 97% 98%  Weight: 191 lb 2.2 oz (86.7 kg)     Height: 5\' 9"  (1.753 m)     PainSc:        Isolation  Precautions No active isolations  Medications Medications  iohexol (OMNIPAQUE) 350 MG/ML injection 100 mL (80 mLs Intravenous Contrast Given 01/27/23 1037)  aspirin EC tablet 81 mg (81 mg Oral Given 01/27/23 1156)  clopidogrel (PLAVIX) tablet 300 mg (300 mg Oral Given 01/27/23 1156)    Mobility walks     Focused Assessments Neuro Assessment Handoff:  Swallow screen pass? No  Cardiac Rhythm: Normal sinus rhythm, Bundle branch block NIH Stroke Scale  Dizziness Present: No Headache Present: No Interval: Initial Level of Consciousness (1a.)   : Alert, keenly responsive LOC Questions (1b. )   : Answers both questions correctly LOC Commands (1c. )   : Performs both tasks correctly Best Gaze (2. )  : Normal Visual (3. )  : No visual loss Facial Palsy (4. )    : Normal symmetrical movements Motor Arm, Left (5a. )   : No drift Motor Arm, Right (5b. ) : No drift Motor Leg, Left (6a. )  : No drift Motor Leg, Right (6b. ) : No drift Limb Ataxia (7. ): Absent Sensory (8. )  : Normal, no sensory loss Best Language (9. )  : No aphasia Dysarthria (10. ): Normal Extinction/Inattention (11.)   : No Abnormality Complete NIHSS TOTAL: 0 Last date known well: 01/26/23 Last time known well: 2300 Neuro Assessment: Exceptions to [facility] Neuro Checks:   Initial (01/27/23 0940)  Has TPA been given? No If patient is a Neuro Trauma and patient is going to OR before floor call report to 4N Charge nurse: [phone] or [phone]   R Recommendations: See Admitting Provider Note  Report given to:   Additional Notes: [id number]

## 2023-01-27 NOTE — ED Triage Notes (Signed)
Reports slurred speech upon waking around 0730 this morning. Endorsed some right sided mouth droop as well. Stated the symptoms lasted about an hour and he sound "normal" now. States he took some motrin and BP meds PTA. Stated he went to bed around 11 pm and no symptoms then.

## 2023-01-27 NOTE — ED Notes (Signed)
Updated patient on plan of care and long wait for admission. Patient verbalized understanding.  Ambulatory to bathroom with steady gait.  No symptoms have returned at this time.

## 2023-01-27 NOTE — ED Provider Notes (Signed)
[facility] Provider Note   CSN: [id number] Arrival date & time: 01/27/23  4098     History  Chief Complaint  Patient presents with   Aphasia    Keith Brewer is a 67 y.o. male.  He has a history of hypertension.  He said he was fine last night but he when he woke up this morning at 7:30 AM he was having some slow and slurred speech.  When he looked in the mirror he said his right side of his smile was up and his left side was down.  When he tried to take his medications he felt clumsy and dropped his pills although he cannot remember which hand felt clumsy.  He took 2 ibuprofen and his blood pressure medicine and rested for about an hour.  His wife was with him and she also asked why he was talking funny.  No blurry vision double vision troubles with balance.  No headache.  Symptoms took about an hour to resolve and he feels they are completely gone now.  No prior history of same.  The history is provided by the patient.  Cerebrovascular Accident This is a new problem. The problem has been resolved. Pertinent negatives include no chest pain, no abdominal pain, no headaches and no shortness of breath. Exacerbated by: Talking. He has tried rest (Ibuprofen) for the symptoms. The treatment provided significant relief.       Home Medications Prior to Admission medications   Medication Sig Start Date End Date Taking? Authorizing Provider  amLODipine (NORVASC) 10 MG tablet Take 10 mg by mouth daily. 07/01/21   [provider]  meclizine (ANTIVERT) 25 MG tablet Take by mouth. 07/01/21   [provider]  Misc Natural Products (COMPLETE PROSTATE HEALTH) TABS Take 2 capsules by mouth daily. 07/01/21   [provider]  Multiple Vitamin (MULTI-VITAMIN) tablet Take 1 tablet by mouth daily.    [provider]  Omega-3 1000 MG CAPS Take by mouth.    [provider]      Allergies    Patient has no known  allergies.    Review of Systems   Review of Systems  Constitutional:  Negative for fever.  HENT:  Negative for sore throat.   Eyes:  Negative for visual disturbance.  Respiratory:  Positive for cough (x 1 month). Negative for shortness of breath.   Cardiovascular:  Negative for chest pain.  Gastrointestinal:  Negative for abdominal pain.  Genitourinary:  Negative for dysuria.  Skin:  Negative for rash.  Neurological:  Positive for speech difficulty. Negative for headaches.    Physical Exam Updated Vital Signs BP (!) 154/81 (BP Location: Left Arm)   Pulse 70   Temp 97.8 F (36.6 C) (Oral)   Resp 18   Ht 5\' 9"  (1.753 m)   Wt 86.7 kg   SpO2 97%   BMI 28.23 kg/m  Physical Exam Vitals and nursing note reviewed.  Constitutional:      General: He is not in acute distress.    Appearance: He is well-developed.  HENT:     Head: Normocephalic and atraumatic.  Eyes:     Conjunctiva/sclera: Conjunctivae normal.  Cardiovascular:     Rate and Rhythm: Normal rate and regular rhythm.     Heart sounds: No murmur heard. Pulmonary:     Effort: Pulmonary effort is normal. No respiratory distress.     Breath sounds: Normal breath sounds.  Abdominal:  Palpations: Abdomen is soft.     Tenderness: There is no abdominal tenderness.  Musculoskeletal:        General: No swelling.     Cervical back: Neck supple.  Skin:    General: Skin is warm and dry.     Capillary Refill: Capillary refill takes less than 2 seconds.  Neurological:     Mental Status: He is alert and oriented to person, place, and time.     Cranial Nerves: No cranial nerve deficit, dysarthria or facial asymmetry.     Sensory: Sensation is intact.     Motor: Motor function is intact.     Coordination: Coordination is intact.     Gait: Gait is intact.  Psychiatric:        Mood and Affect: Mood normal.     ED Results / Procedures / Treatments   Labs (all labs ordered are listed, but only abnormal results are  displayed) Labs Reviewed  CBC - Abnormal; Notable for the following components:      Result Value   WBC 3.5 (*)    All other components within normal limits  COMPREHENSIVE METABOLIC PANEL - Abnormal; Notable for the following components:   Glucose, Bld 110 (*)    All other components within normal limits  CBG MONITORING, ED - Abnormal; Notable for the following components:   Glucose-Capillary 109 (*)    All other components within normal limits  ETHANOL  PROTIME-INR  APTT  DIFFERENTIAL  RAPID URINE DRUG SCREEN, HOSP PERFORMED  URINALYSIS, ROUTINE W REFLEX MICROSCOPIC    EKG EKG Interpretation Date/Time:  Tuesday January 27 2023 09:54:08 EDT Ventricular Rate:  62 PR Interval:  184 QRS Duration:  106 QT Interval:  420 QTC Calculation: 427 R Axis:   86  Text Interpretation: Sinus rhythm Borderline right axis deviation Borderline T abnormalities, inferior leads t wave changes more pronounced than prior 25-Jan-2023 Confirmed by Meridee Score [phone]) on 01/27/2023 9:59:04 AM  Radiology CT ANGIO HEAD NECK W WO CM  Result Date: 01/27/2023 CLINICAL DATA:  Slurred speech EXAM: CT ANGIOGRAPHY HEAD AND NECK WITH AND WITHOUT CONTRAST TECHNIQUE: Multidetector CT imaging of the head and neck was performed using the standard protocol during bolus administration of intravenous contrast. Multiplanar CT image reconstructions and MIPs were obtained to evaluate the vascular anatomy. Carotid stenosis measurements (when applicable) are obtained utilizing NASCET criteria, using the distal internal carotid diameter as the denominator. RADIATION DOSE REDUCTION: This exam was performed according to the departmental dose-optimization program which includes automated exposure control, adjustment of the mA and/or kV according to patient size and/or use of iterative reconstruction technique. CONTRAST:  80mL OMNIPAQUE IOHEXOL 350 MG/ML SOLN COMPARISON:  None Available. FINDINGS: CT HEAD FINDINGS Brain: No evidence of acute  infarction, hemorrhage, hydrocephalus, extra-axial collection or mass lesion/mass effect. Sequela of mild chronic microvascular ischemic change. Vascular: See below Skull: Normal. Negative for fracture or focal lesion. Sinuses/Orbits: No middle ear or mastoid effusion. Paranasal sinuses are clear. Orbits are unremarkable. Other: None. Review of the MIP images confirms the above findings CTA NECK FINDINGS Aortic arch: Standard branching. Imaged portion shows no evidence of aneurysm or dissection. No significant stenosis of the major arch vessel origins. Right carotid system: No evidence of dissection, stenosis (50% or greater), or occlusion. Left carotid system: No evidence of dissection, stenosis (50% or greater), or occlusion. Vertebral arteries: Codominant. No evidence of dissection, stenosis (50% or greater), or occlusion. Skeleton: Negative. Other neck: Negative. Upper chest: Negative. Review of the MIP  images confirms the above findings CTA HEAD FINDINGS Anterior circulation: No significant stenosis, proximal occlusion, aneurysm, or vascular malformation. Posterior circulation: No significant stenosis, proximal occlusion, aneurysm, or vascular malformation. Venous sinuses: As permitted by contrast timing, patent. Anatomic variants: None Review of the MIP images confirms the above findings IMPRESSION: 1. No hemorrhage or CT evidence of an acute cortical infarct 2. No intracranial large vessel occlusion or significant stenosis. 3. No hemodynamically significant stenosis in the neck. Electronically Signed   By: Lorenza Cambridge M.D.   On: 01/27/2023 11:24    Procedures Procedures    Medications Ordered in ED Medications  iohexol (OMNIPAQUE) 350 MG/ML injection 100 mL (80 mLs Intravenous Contrast Given 01/27/23 1037)  aspirin EC tablet 81 mg (81 mg Oral Given 01/27/23 1156)  clopidogrel (PLAVIX) tablet 300 mg (300 mg Oral Given 01/27/23 1156)    ED Course/ Medical Decision Making/ A&P Clinical Course as of  01/27/23 1217  Tue Jan 27, 2023  1004 Reviewed case with Dr. Amada Jupiter neurology.  As he is out of the window not having any symptoms have not done formal stroke activation.  He is recommending CTA head and neck and will need admission for further TIA workup. [MB]    Clinical Course User Index [MB] Terrilee Files, MD                             Medical Decision Making Amount and/or Complexity of Data Reviewed Labs: ordered. Radiology: ordered.  Risk OTC drugs. Prescription drug management. Decision regarding hospitalization.   This patient complains of garbled speech possible left facial droop; this involves an extensive number of treatment Options and is a complaint that carries with it a high risk of complications and morbidity. The differential includes stroke, TIA, seizure, migraine  I ordered, reviewed and interpreted labs, which included CBC with mildly low white count normal hemoglobin, chemistries LFTs unremarkable I ordered medication aspirin and Plavix and reviewed PMP when indicated. I ordered imaging studies which included CT angio head and neck and I independently    visualized and interpreted imaging which showed no acute findings Additional history obtained from patient's wife Previous records obtained and reviewed in epic no recent admissions I consulted Dr. Amada Jupiter neurology and [facility] hospitalist Dr. Alinda Money and discussed lab and imaging findings and discussed disposition.  Cardiac monitoring reviewed, normal sinus rhythm Social determinants considered, no significant barriers Critical Interventions: None  After the interventions stated above, I reevaluated the patient and found patient to be neuro intact without any deficiencies Admission and further testing considered, patient would benefit her admission to the hospital for further TIA workup.  He is in agreement with plan for admission.         Final Clinical Impression(s) / ED Diagnoses Final  diagnoses:  TIA (transient ischemic attack)  Aphasia    Rx / DC Orders ED Discharge Orders     None         Terrilee Files, MD 01/27/23 [phone]

## 2023-01-27 NOTE — Consult Note (Signed)
[facility] Telemedicine Consult   Requesting Provider: Gardner Candle Consult Participants: Patient, nurse Location of the provider: [facility] Location of the patient: [facility]  This consult was provided via telemedicine with 2-way video and audio communication. The patient/family was informed that care would be provided in this way and agreed to receive care in this manner.    Chief Complaint: Difficulty speaking  HPI: 67yo M with some difficulty speaking when he woke up. He was slurring his words and then noticed that he dropped some medications which is unusual for him. His wife reported that his left face was lower than the right.  His symptoms were all resolved and under 30 minutes    LKW: 11:30 pm last night tpa given?: No, out of window IR Thrombectomy? No, mild symptoms Modified Rankin Scale: 0-Completely asymptomatic and back to baseline post- stroke Time of teleneurologist evaluation: 12:57 pm Abcd2: 4  Exam: Vitals:   01/27/23 1015 01/27/23 1130  BP: 135/87 (!) 162/99  Pulse: (!) 59 63  Resp: 16 20  Temp:    SpO2: 97% 97%    General: In bed NAD  1A: Level of Consciousness - 0 1B: Ask Month and Age - 0 1C: 'Blink Eyes' & 'Squeeze Hands' - 0 2: Test Horizontal Extraocular Movements - 0 3: Test Visual Fields - 0 4: Test Facial Palsy - 0 5A: Test Left Arm Motor Drift - 0 5B: Test Right Arm Motor Drift - 0 6A: Test Left Leg Motor Drift - 0 6B: Test Right Leg Motor Drift - 0 7: Test Limb Ataxia - 0 8: Test Sensation - 0 9: Test Language/Aphasia- 0 10: Test Dysarthria - 0 11: Test Extinction/Inattention - 0 NIHSS score: 0   Imaging Reviewed: CTA - negative  Labs reviewed in epic and pertinent values follow: Results for orders placed or performed during the hospital encounter of 01/27/23 (from the past 24 hour(s))  CBG monitoring, ED     Status: Abnormal   Collection Time: 01/27/23  9:33 AM  Result Value Ref Range   Glucose-Capillary  109 (H) 70 - 99 mg/dL  Urine rapid drug screen (hosp performed)     Status: None   Collection Time: 01/27/23  9:48 AM  Result Value Ref Range   Opiates NONE DETECTED NONE DETECTED   Cocaine NONE DETECTED NONE DETECTED   Benzodiazepines NONE DETECTED NONE DETECTED   Amphetamines NONE DETECTED NONE DETECTED   Tetrahydrocannabinol NONE DETECTED NONE DETECTED   Barbiturates NONE DETECTED NONE DETECTED  Urinalysis, Routine w reflex microscopic -Urine, Clean Catch     Status: None   Collection Time: 01/27/23  9:48 AM  Result Value Ref Range   Color, Urine YELLOW YELLOW   APPearance CLEAR CLEAR   Specific Gravity, Urine 1.010 1.005 - 1.030   pH 5.5 5.0 - 8.0   Glucose, UA NEGATIVE NEGATIVE mg/dL   Hgb urine dipstick NEGATIVE NEGATIVE   Bilirubin Urine NEGATIVE NEGATIVE   Ketones, ur NEGATIVE NEGATIVE mg/dL   Protein, ur NEGATIVE NEGATIVE mg/dL   Nitrite NEGATIVE NEGATIVE   Leukocytes,Ua NEGATIVE NEGATIVE  Ethanol     Status: None   Collection Time: 01/27/23  9:55 AM  Result Value Ref Range   Alcohol, Ethyl (B) <10 <10 mg/dL  Protime-INR     Status: None   Collection Time: 01/27/23  9:55 AM  Result Value Ref Range   Prothrombin Time 13.6 11.4 - 15.2 seconds   INR 1.0 0.8 - 1.2  APTT  Status: None   Collection Time: 01/27/23  9:55 AM  Result Value Ref Range   aPTT 27 24 - 36 seconds  CBC     Status: Abnormal   Collection Time: 01/27/23  9:55 AM  Result Value Ref Range   WBC 3.5 (L) 4.0 - 10.5 K/uL   RBC 5.05 4.22 - 5.81 MIL/uL   Hemoglobin 15.2 13.0 - 17.0 g/dL   HCT 81.1 91.4 - 78.2 %   MCV 85.7 80.0 - 100.0 fL   MCH 30.1 26.0 - 34.0 pg   MCHC 35.1 30.0 - 36.0 g/dL   RDW 95.6 21.3 - 08.6 %   Platelets 197 150 - 400 K/uL   nRBC 0.0 0.0 - 0.2 %  Differential     Status: None   Collection Time: 01/27/23  9:55 AM  Result Value Ref Range   Neutrophils Relative % 47 %   Neutro Abs 1.7 1.7 - 7.7 K/uL   Lymphocytes Relative 39 %   Lymphs Abs 1.4 0.7 - 4.0 K/uL   Monocytes  Relative 12 %   Monocytes Absolute 0.4 0.1 - 1.0 K/uL   Eosinophils Relative 1 %   Eosinophils Absolute 0.0 0.0 - 0.5 K/uL   Basophils Relative 1 %   Basophils Absolute 0.0 0.0 - 0.1 K/uL   Immature Granulocytes 0 %   Abs Immature Granulocytes 0.01 0.00 - 0.07 K/uL  Comprehensive metabolic panel     Status: Abnormal   Collection Time: 01/27/23  9:55 AM  Result Value Ref Range   Sodium 135 135 - 145 mmol/L   Potassium 3.6 3.5 - 5.1 mmol/L   Chloride 100 98 - 111 mmol/L   CO2 24 22 - 32 mmol/L   Glucose, Bld 110 (H) 70 - 99 mg/dL   BUN 13 8 - 23 mg/dL   Creatinine, Ser 5.78 0.61 - 1.24 mg/dL   Calcium 9.1 8.9 - 46.9 mg/dL   Total Protein 7.4 6.5 - 8.1 g/dL   Albumin 4.3 3.5 - 5.0 g/dL   AST 35 15 - 41 U/L   ALT 37 0 - 44 U/L   Alkaline Phosphatase 64 38 - 126 U/L   Total Bilirubin 0.8 0.3 - 1.2 mg/dL   GFR, Estimated >62 >95 mL/min   Anion gap 11 5 - 15      Assessment: 67 year old male with stroke risk factor of hypertension who presents with transient facial droop, speech changes, and likely hand weakness based on dropping things.  I suspect that this represents transient ischemic attack and with ABCD2 of four would recommend admission for stroke risk factor assessment.  Recommendations:  - HgbA1c, fasting lipid panel - MRI of the brain without contrast - Frequent neuro checks - Echocardiogram - Prophylactic therapy-Antiplatelet med: Aspirin - dose 81mg  and plavix 75mg  daily  after 300mg  load  - Risk factor modification - Telemetry monitoring - Stroke team to follow once at [facility], MD [facility] [phone]  If 7pm- 7am, please page neurology on call as listed in AMION.

## 2023-01-27 NOTE — ED Notes (Signed)
Patient ambulatory to the BR 

## 2023-01-28 ENCOUNTER — Other Ambulatory Visit (HOSPITAL_COMMUNITY): Payer: Self-pay

## 2023-01-28 ENCOUNTER — Observation Stay (HOSPITAL_BASED_OUTPATIENT_CLINIC_OR_DEPARTMENT_OTHER): Payer: Medicare PPO

## 2023-01-28 DIAGNOSIS — G459 Transient cerebral ischemic attack, unspecified: Secondary | ICD-10-CM | POA: Diagnosis not present

## 2023-01-28 LAB — BASIC METABOLIC PANEL
Anion gap: 9 (ref 5–15)
BUN: 12 mg/dL (ref 8–23)
CO2: 24 mmol/L (ref 22–32)
Calcium: 9.2 mg/dL (ref 8.9–10.3)
Chloride: 103 mmol/L (ref 98–111)
Creatinine, Ser: 1.16 mg/dL (ref 0.61–1.24)
GFR, Estimated: >60 mL/min (ref >60)
Glucose, Bld: 102 mg/dL — ABNORMAL HIGH (ref 70–99)
Potassium: 3.5 mmol/L (ref 3.5–5.1)
Sodium: 136 mmol/L (ref 135–145)

## 2023-01-28 LAB — ECHOCARDIOGRAM COMPLETE
AR max vel: 3.79 cm2
AV Area VTI: 3.75 cm2
AV Area mean vel: 3.57 cm2
AV Mean grad: 2.5 mmHg
AV Peak grad: 3.9 mmHg
Ao pk vel: 0.99 m/s
Area-P 1/2: 2.18 cm2
Calc EF: 52.3 %
Height: 69 in
MV VTI: 4.48 cm2
S' Lateral: 3.8 cm
Single Plane A2C EF: 50.4 %
Single Plane A4C EF: 52.2 %
Weight: 3058.22 oz

## 2023-01-28 LAB — CBC WITH DIFFERENTIAL/PLATELET
Abs Immature Granulocytes: 0.01 10*3/uL (ref 0.00–0.07)
Basophils Absolute: 0 10*3/uL (ref 0.0–0.1)
Basophils Relative: 0 %
Eosinophils Absolute: 0.1 10*3/uL (ref 0.0–0.5)
Eosinophils Relative: 2 %
HCT: 44.1 % (ref 39.0–52.0)
Hemoglobin: 15.3 g/dL (ref 13.0–17.0)
Immature Granulocytes: 0 %
Lymphocytes Relative: 42 %
Lymphs Abs: 1.7 10*3/uL (ref 0.7–4.0)
MCH: 29.9 pg (ref 26.0–34.0)
MCHC: 34.7 g/dL (ref 30.0–36.0)
MCV: 86.1 fL (ref 80.0–100.0)
Monocytes Absolute: 0.4 10*3/uL (ref 0.1–1.0)
Monocytes Relative: 11 %
Neutro Abs: 1.8 10*3/uL (ref 1.7–7.7)
Neutrophils Relative %: 45 %
Platelets: 190 10*3/uL (ref 150–400)
RBC: 5.12 MIL/uL (ref 4.22–5.81)
RDW: 13.2 % (ref 11.5–15.5)
WBC: 4 10*3/uL (ref 4.0–10.5)
nRBC: 0 % (ref 0.0–0.2)

## 2023-01-28 LAB — GLUCOSE, CAPILLARY: Glucose-Capillary: 155 mg/dL — ABNORMAL HIGH (ref 70–99)

## 2023-01-28 MED ORDER — ATORVASTATIN CALCIUM 40 MG PO TABS
40.0000 mg | ORAL_TABLET | Freq: Every day | ORAL | 0 refills | Status: AC
  Filled 2023-01-28: qty 30, 30d supply, fill #0

## 2023-01-28 MED ORDER — CLOPIDOGREL BISULFATE 75 MG PO TABS
75.0000 mg | ORAL_TABLET | Freq: Every day | ORAL | 0 refills | Status: AC
  Filled 2023-01-28: qty 21, 21d supply, fill #0

## 2023-01-28 MED ORDER — ASPIRIN 81 MG PO TBEC
81.0000 mg | DELAYED_RELEASE_TABLET | Freq: Every day | ORAL | 0 refills | Status: AC
  Filled 2023-01-28: qty 120, 120d supply, fill #0

## 2023-01-28 NOTE — Progress Notes (Signed)
SLP Cancellation Note  Patient Details Name: Keith Brewer MRN: [id number] DOB: [birth year]   Cancelled treatment:       Reason Eval/Treat Not Completed: SLP screened, no needs identified, will sign off   Shyhiem Beeney, Riley Nearing 01/28/2023, 8:10 AM

## 2023-01-28 NOTE — Progress Notes (Addendum)
STROKE TEAM PROGRESS NOTE   INTERVAL HISTORY No family is present at bedside.  Patient is awake, alert and in no acute distress.  There are no residual or new focal neuro deficits.  Vital signs are stable.  Patient reports desire to implement lifestyle changes including diet and exercise to improve his health.  MRI negative for acute intracranial abnormality. LDL above goal, discussed with patient who understands importance of diet, lifestyle modifications, and atorvastatin.  Vitals:   01/27/23 1700 01/28/23 0418 01/28/23 0828 01/28/23 1124  BP: (!) 142/88 117/75 128/74 122/74  Pulse: 64 (!) 59 64 65  Resp: 15  18 18   Temp: 97.9 F (36.6 C) 98.2 F (36.8 C) 97.8 F (36.6 C) 98.4 F (36.9 C)  TempSrc: Oral Oral Axillary Oral  SpO2: 99% 98% 98% 98%  Weight:      Height:       CBC:  Recent Labs  Lab 01/27/23 0955 01/28/23 0249  WBC 3.5* 4.0  NEUTROABS 1.7 1.8  HGB 15.2 15.3  HCT 43.3 44.1  MCV 85.7 86.1  PLT 197 190    Basic Metabolic Panel:  Recent Labs  Lab 01/27/23 0955 01/28/23 0249  NA 135 136  K 3.6 3.5  CL 100 103  CO2 24 24  GLUCOSE 110* 102*  BUN 13 12  CREATININE 1.15 1.16  CALCIUM 9.1 9.2    Lipid Panel:  Recent Labs  Lab 01/27/23 2022  CHOL 233*  TRIG 133  HDL 64  CHOLHDL 3.6  VLDL 27  LDLCALC 409*    HgbA1c:  Recent Labs  Lab 01/27/23 2022  HGBA1C 5.9*    Urine Drug Screen:  Recent Labs  Lab 01/27/23 0948  LABOPIA NONE DETECTED  COCAINSCRNUR NONE DETECTED  LABBENZ NONE DETECTED  AMPHETMU NONE DETECTED  THCU NONE DETECTED  LABBARB NONE DETECTED     Alcohol Level  Recent Labs  Lab 01/27/23 0955  ETH <10     IMAGING past 24 hours ECHOCARDIOGRAM COMPLETE  Result Date: 01/28/2023    ECHOCARDIOGRAM REPORT   Patient Name:   Keith Brewer Date of Exam: 01/28/2023 Medical Rec #:  [id number]      Height:       69.0 in Accession #:    [id number]     Weight:       191.1 lb Date of Birth:  [birth year]      BSA:          2.027 m  Patient Age:    66 years       BP:           130/59 mmHg Patient Gender: M              HR:           64 bpm. Exam Location:  Inpatient Procedure: 2D Echo, Cardiac Doppler and Color Doppler Indications:    TIA  History:        Patient has no prior history of Echocardiogram examinations. TIA                 and hx cancer; Risk Factors:Hypertension.  Sonographer:    Wallie Char Referring Phys: [id number] JENNIFER YATES IMPRESSIONS  1. Left ventricular ejection fraction, by estimation, is 50 to 55%. The left ventricle has low normal function. The left ventricle has no regional wall motion abnormalities. Left ventricular diastolic parameters are consistent with Grade I diastolic dysfunction (impaired relaxation).  2. Right ventricular systolic function is normal. The right ventricular size  is normal.  3. The mitral valve is normal in structure. Trivial mitral valve regurgitation. No evidence of mitral stenosis.  4. The aortic valve is tricuspid. There is mild calcification of the aortic valve. Aortic valve regurgitation is not visualized. No aortic stenosis is present.  5. Aortic dilatation noted. There is mild dilatation of the aortic root, measuring 40 mm.  6. The inferior vena cava is normal in size with greater than 50% respiratory variability, suggesting right atrial pressure of 3 mmHg. FINDINGS  Left Ventricle: There is mild septal dyssnergy likely due to conduction abnormality. Left ventricular ejection fraction, by estimation, is 50 to 55%. The left ventricle has low normal function. The left ventricle has no regional wall motion abnormalities. The left ventricular internal cavity size was normal in size. There is no left ventricular hypertrophy. Left ventricular diastolic parameters are consistent with Grade I diastolic dysfunction (impaired relaxation). Right Ventricle: The right ventricular size is normal. No increase in right ventricular wall thickness. Right ventricular systolic function is normal. Left  Atrium: Left atrial size was normal in size. Right Atrium: Right atrial size was normal in size. Pericardium: There is no evidence of pericardial effusion. Mitral Valve: The mitral valve is normal in structure. Mild mitral annular calcification. Trivial mitral valve regurgitation. No evidence of mitral valve stenosis. MV peak gradient, 1.6 mmHg. The mean mitral valve gradient is 0.0 mmHg. Tricuspid Valve: The tricuspid valve is normal in structure. Tricuspid valve regurgitation is trivial. No evidence of tricuspid stenosis. Aortic Valve: The aortic valve is tricuspid. There is mild calcification of the aortic valve. Aortic valve regurgitation is not visualized. No aortic stenosis is present. Aortic valve mean gradient measures 2.5 mmHg. Aortic valve peak gradient measures 3.9 mmHg. Aortic valve area, by VTI measures 3.75 cm. Pulmonic Valve: The pulmonic valve was normal in structure. Pulmonic valve regurgitation is trivial. No evidence of pulmonic stenosis. Aorta: Aortic dilatation noted. There is mild dilatation of the aortic root, measuring 40 mm. Venous: The inferior vena cava is normal in size with greater than 50% respiratory variability, suggesting right atrial pressure of 3 mmHg. IAS/Shunts: No atrial level shunt detected by color flow Doppler.  LEFT VENTRICLE PLAX 2D LVIDd:         4.80 cm     Diastology LVIDs:         3.80 cm     LV e' medial:    4.95 cm/s LV PW:         0.80 cm     LV E/e' medial:  9.4 LV IVS:        0.80 cm     LV e' lateral:   8.28 cm/s LVOT diam:     2.50 cm     LV E/e' lateral: 5.6 LV SV:         77 LV SV Index:   38 LVOT Area:     4.91 cm  LV Volumes (MOD) LV vol d, MOD A2C: 79.6 ml LV vol d, MOD A4C: 97.1 ml LV vol s, MOD A2C: 39.5 ml LV vol s, MOD A4C: 46.4 ml LV SV MOD A2C:     40.1 ml LV SV MOD A4C:     97.1 ml LV SV MOD BP:      46.7 ml RIGHT VENTRICLE             IVC RV Basal diam:  2.60 cm     IVC diam: 1.80 cm RV S prime:     12.40 cm/s  TAPSE (M-mode): 1.9 cm LEFT ATRIUM              Index        RIGHT ATRIUM           Index LA diam:        2.90 cm 1.43 cm/m   RA Area:     11.70 cm LA Vol (A2C):   31.7 ml 15.64 ml/m  RA Volume:   24.90 ml  12.29 ml/m LA Vol (A4C):   30.7 ml 15.15 ml/m LA Biplane Vol: 31.8 ml 15.69 ml/m  AORTIC VALVE AV Area (Vmax):    3.79 cm AV Area (Vmean):   3.57 cm AV Area (VTI):     3.75 cm AV Vmax:           99.25 cm/s AV Vmean:          74.700 cm/s AV VTI:            0.205 m AV Peak Grad:      3.9 mmHg AV Mean Grad:      2.5 mmHg LVOT Vmax:         76.70 cm/s LVOT Vmean:        54.300 cm/s LVOT VTI:          0.156 m LVOT/AV VTI ratio: 0.76  AORTA Ao Root diam: 4.00 cm Ao Asc diam:  3.40 cm MITRAL VALVE MV Area (PHT): 2.18 cm    SHUNTS MV Area VTI:   4.48 cm    Systemic VTI:  0.16 m MV Peak grad:  1.6 mmHg    Systemic Diam: 2.50 cm MV Mean grad:  0.0 mmHg MV Vmax:       0.63 m/s MV Vmean:      30.4 cm/s MV Decel Time: 348 msec MV E velocity: 46.30 cm/s MV A velocity: 58.90 cm/s MV E/A ratio:  0.79 Arvilla Meres MD Electronically signed by Arvilla Meres MD Signature Date/Time: 01/28/2023/10:43:46 AM    Final    MR BRAIN WO CONTRAST  Result Date: 01/28/2023 CLINICAL DATA:  Slurred speech, facial droop, stroke suspected EXAM: MRI HEAD WITHOUT CONTRAST TECHNIQUE: Multiplanar, multiecho pulse sequences of the brain and surrounding structures were obtained without intravenous contrast. COMPARISON:  No prior MRI available, correlation is made with CTA head neck 01/27/2023 FINDINGS: Brain: No restricted diffusion to suggest acute or subacute infarct. No acute hemorrhage, mass, mass effect, or midline shift. No hydrocephalus or extra-axial collection. Normal pituitary and craniocervical junction. No hemosiderin deposition to suggest remote hemorrhage. Normal cerebral volume for age. Vascular: Normal arterial flow voids. Skull and upper cervical spine: Normal marrow signal. Sinuses/Orbits: Clear paranasal sinuses. No acute finding in the orbits. Other:  The mastoid air cells are well aerated. IMPRESSION: No acute intracranial process. No evidence of acute or subacute infarct. Electronically Signed   By: Wiliam Ke M.D.   On: 01/28/2023 02:01   DG Chest 2 View  Result Date: 01/27/2023 CLINICAL DATA:  Cough. EXAM: CHEST - 2 VIEW COMPARISON:  Radiograph 01/05/2019 FINDINGS: The cardiomediastinal contours are normal. The lungs are clear. Pulmonary vasculature is normal. No consolidation, pleural effusion, or pneumothorax. No acute osseous abnormalities are seen. IMPRESSION: No acute findings or explanation for cough. Electronically Signed   By: Narda Rutherford M.D.   On: 01/27/2023 22:06    PHYSICAL EXAM Constitutional: Appears well-developed and well-nourished.  Psych: Affect appropriate to situation, calm and cooperative with exam Eyes: No scleral injection HENT: No OP obstrucion MSK: no joint deformities  or swelling Cardiovascular: Normal rate and regular rhythm.  Respiratory: Effort normal, non-labored breathing GI: Soft.  No distension. There is no tenderness.  Skin: WDI  Neuro: Mental Status: Patient is awake, alert, oriented to person, place, month, year, and situation. Patient is able to give a clear and coherent history. No signs of aphasia or neglect Cranial Nerves: II: Visual Fields are full. Pupils are equal, round, and reactive to light.   III,IV, VI: EOMI without ptosis or diploplia.  V: Facial sensation is intact and symmetric to light touch VII: Facial movement is symmetric resting and with movement VIII: Hearing is intact to voice X: Palate elevates symmetrically XI: Shoulder shrug is symmetric. XII: Tongue protrudes midline without atrophy or fasciculations.  Motor: Tone is normal. Bulk is normal. 5/5 strength was present in all four extremities.  Sensory: Sensation is symmetric to light touch and temperature in the arms and legs. No extinction to DSS present.  Cerebellar: FNF and HKS are intact  bilaterally  ASSESSMENT/PLAN Mr. Keith Brewer is a 67 y.o. male with history of HTN presenting [facility] with transient slurred/slowed speech, left facial droop, extremity weakness.  Symptoms had resolved after approximately 30 minutes of onset.  Patient is not a candidate for TNK as he had no focal deficit on arrival and patient presented outside of the thrombolytic therapy time window.  Patient was evaluated by teleneurology and transferred to [facility] for TIA workup.  TIA: Likely right-sided TIA etiology likely small vessel disease CT head + CTA head and neck 01/27/23: No hemorrhage or CT evidence of acute cortical infarct.  No LVO or significant stenosis.  No hemodynamically significant stenosis in the neck. MRI no acute intracranial process.  No evidence of acute or subacute infarct. 2D Echo LVEF 50-55%. LDL 142 HgbA1c 5.9 UDS neg VTE prophylaxis -Lovenox subcu No antithrombotic prior to admission, now on aspirin 81 mg daily and clopidogrel 75 mg daily for 3 weeks followed by aspirin monotherapy Therapy recommendations:  None Disposition:  home   Hypertension Home meds:  amlodipine  Stable Long-term BP goal normotensive   Hyperlipidemia Home meds:  none LDL 142, goal < 70 Add atorvastatin 40 mg daily  Continue statin at discharge   Other Stroke Risk Factors Advanced Age >/= 70  Family hx stroke (patient's mother)   Other Active Problems Mild leukopenia, WBC 3.5  Hospital day # 0  Lanae Boast, AGACNP-BC [facility] Pager: [phone]  ATTENDING NOTE: I reviewed above note and agree with the assessment and plan. Pt was seen and examined.   No family at bedside.  Patient reclining in bed, neurologically intact.  Patient symptoms of slurred speech, left facial droop, left hand clumsiness, lasting 30 to 45 minutes with negative MRI, consistent with TIA.  Risk factor including hypertension and hyperlipidemia.  Started on DAPT for 3 weeks and then aspirin alone,  as well as Lipitor 40.  Aggressive stroke risk factor modification.  PT and OT no recommendation.  For detailed assessment and plan, please refer to above/below as I have made changes wherever appropriate.   Neurology will sign off. Please call with questions. Pt will follow up with stroke clinic NP at [facility] in about 4 weeks. Thanks for the consult.   Marvel Plan, MD PhD Stroke Neurology 01/28/2023 4:10 PM    To contact Stroke Continuity provider, please refer to [email or web address]. After hours, contact General Neurology

## 2023-01-28 NOTE — Discharge Summary (Signed)
Physician Discharge Summary  Keith Brewer ZOX:[id number] DOB: 12-13-1955 DOA: 01/27/2023  PCP: Kallie Locks, FNP  Admit date: 01/27/2023 Discharge date: 01/28/2023 Recommendations for Outpatient Follow-up:  Follow up with PCP in 1 weeks-call for appointment Please obtain BMP/CBC in one week  Discharge Dispo: Home Discharge Condition: Stable Code Status:   Code Status: Full Code Diet recommendation:  Diet Order             Diet Heart Fluid consistency: Thin  Diet effective ____                    Brief/Interim Summary: 66 y.o.m w/ colitis, gastric and duodenal ulcers, and HTN presented with aphasia.He reported that he went ot bed about 1130 01/26/23 woke next am and was noticing some breathing problems from his nose. He was trying to talk to his wife and it was slurred.  Left side of his mouth was drooping.  He took 2 ibuprofen and then spilled all of his medication on the floor, clumsy but not clearly weak.  He was able to shave and shower without difficulty and seemed to be better after.  After 30 minutes, he still had a mild droop but was able to speak normally.  Droop resolved about 15 minutes later.    ER Course:  [facility] to [facility] transfer and being admitted for 1 hour of slowed and slurred speech, left facial droop, clumsiness of the hands with negative workup for possible TIA-completed stroke workup see below,.  Once cleared by neurology patient will be discharged home      Discharge Diagnoses:  Principal Problem:   TIA (transient ischemic attack) Active Problems:   Essential hypertension   History of gastrointestinal ulcer  TIA with episode of left facial droop, aphasia: Symptoms transient and resolved.  Seen by neurology.  Workup so far shows MRI brain negative for acute finding-LDL 142, HbA1c 5.9, UDS negative. Follow-up echo-shows EF 50-55% no RWMA, G1 DD mitral valve normal aortic valve tricuspid.PTA not on antiplatelets, started on on aspirin and Plavix x3 wk then asa  alone, cont Lipitor 40  Hypertension:BP fairly controlled, resume home meds amlodipine slowly Hyperlipidemia LDL not at goal starting Lipitor. History of ulcer continue PPI while on DAPT  Consults: neuro Subjective: Alert awake oriented resting comfortably no complaints no facial weakness or aphasia  Discharge Exam: Vitals:   01/28/23 0828 01/28/23 1124  BP: 128/74 122/74  Pulse: 64 65  Resp: 18 18  Temp: 97.8 F (36.6 C) 98.4 F (36.9 C)  SpO2: 98% 98%   General: Pt is alert, awake, not in acute distress Cardiovascular: RRR, S1/S2 +, no rubs, no gallops Respiratory: CTA bilaterally, no wheezing, no rhonchi Abdominal: Soft, NT, ND, bowel sounds + Extremities: no edema, no cyanosis  Discharge Instructions  Discharge Instructions     Amb Referral to Nutrition and Diabetic Education   Complete by: As directed    Interested in diet education to prevent CVA, eats a lot of fast food and bacon   Ambulatory referral to Neurology   Complete by: As directed    An appointment is requested in approximately: 2 weeks   Discharge instructions   Complete by: As directed    Please call call MD or return to ER for similar or worsening recurring problem that brought you to hospital or if any fever,nausea/vomiting,abdominal pain, uncontrolled pain, chest pain,  shortness of breath or any other alarming symptoms.  Please follow-up your doctor as instructed in a week time and  call the office for appointment.  Please avoid alcohol, smoking, or any other illicit substance and maintain healthy habits including taking your regular medications as prescribed.  You were cared for by a hospitalist during your hospital stay. If you have any questions about your discharge medications or the care you received while you were in the hospital after you are discharged, you can call the unit and ask to speak with the hospitalist on call if the hospitalist that took care of you is not available.  Once you  are discharged, your primary care physician will handle any further medical issues. Please note that NO REFILLS for any discharge medications will be authorized once you are discharged, as it is imperative that you return to your primary care physician (or establish a relationship with a primary care physician if you do not have one) for your aftercare needs so that they can reassess your need for medications and monitor your lab values   Increase activity slowly   Complete by: As directed       Allergies as of 01/28/2023   No Known Allergies      Medication List     TAKE these medications    amLODipine 10 MG tablet Commonly known as: NORVASC Take 10 mg by mouth daily.   aspirin EC 81 MG tablet Take 1 tablet (81 mg total) by mouth daily. Swallow whole. Start taking on: January 29, 2023   atorvastatin 40 MG tablet Commonly known as: LIPITOR Take 1 tablet (40 mg total) by mouth daily. Start taking on: January 29, 2023   clopidogrel 75 MG tablet Commonly known as: PLAVIX Take 1 tablet (75 mg total) by mouth daily for 21 days. Start taking on: January 29, 2023   ibuprofen 200 MG tablet Commonly known as: ADVIL Take 400 mg by mouth daily as needed for moderate pain or headache.   multivitamin tablet Take 1 tablet by mouth daily.   PROSTATE HEALTH PO Take 1 tablet by mouth daily.   VITAMIN D-3 PO Take 1 capsule by mouth daily.   VITAMIN E PO Take 1 capsule by mouth daily.        No Known Allergies  The results of significant diagnostics from this hospitalization (including imaging, microbiology, ancillary and laboratory) are listed below for reference.    Microbiology: No results found for this or any previous visit (from the past 240 hour(s)).  Procedures/Studies: ECHOCARDIOGRAM COMPLETE  Result Date: 01/28/2023    ECHOCARDIOGRAM REPORT   Patient Name:   Keith Brewer Date of Exam: 01/28/2023 Medical Rec #:  [id number]      Height:       69.0 in Accession #:     [id number]     Weight:       191.1 lb Date of Birth:  [birth year]      BSA:          2.027 m Patient Age:    66 years       BP:           130/59 mmHg Patient Gender: M              HR:           64 bpm. Exam Location:  Inpatient Procedure: 2D Echo, Cardiac Doppler and Color Doppler Indications:    TIA  History:        Patient has no prior history of Echocardiogram examinations. TIA  and hx cancer; Risk Factors:Hypertension.  Sonographer:    Wallie Char Referring Phys: [id number] JENNIFER YATES IMPRESSIONS  1. Left ventricular ejection fraction, by estimation, is 50 to 55%. The left ventricle has low normal function. The left ventricle has no regional wall motion abnormalities. Left ventricular diastolic parameters are consistent with Grade I diastolic dysfunction (impaired relaxation).  2. Right ventricular systolic function is normal. The right ventricular size is normal.  3. The mitral valve is normal in structure. Trivial mitral valve regurgitation. No evidence of mitral stenosis.  4. The aortic valve is tricuspid. There is mild calcification of the aortic valve. Aortic valve regurgitation is not visualized. No aortic stenosis is present.  5. Aortic dilatation noted. There is mild dilatation of the aortic root, measuring 40 mm.  6. The inferior vena cava is normal in size with greater than 50% respiratory variability, suggesting right atrial pressure of 3 mmHg. FINDINGS  Left Ventricle: There is mild septal dyssnergy likely due to conduction abnormality. Left ventricular ejection fraction, by estimation, is 50 to 55%. The left ventricle has low normal function. The left ventricle has no regional wall motion abnormalities. The left ventricular internal cavity size was normal in size. There is no left ventricular hypertrophy. Left ventricular diastolic parameters are consistent with Grade I diastolic dysfunction (impaired relaxation). Right Ventricle: The right ventricular size is normal. No increase in  right ventricular wall thickness. Right ventricular systolic function is normal. Left Atrium: Left atrial size was normal in size. Right Atrium: Right atrial size was normal in size. Pericardium: There is no evidence of pericardial effusion. Mitral Valve: The mitral valve is normal in structure. Mild mitral annular calcification. Trivial mitral valve regurgitation. No evidence of mitral valve stenosis. MV peak gradient, 1.6 mmHg. The mean mitral valve gradient is 0.0 mmHg. Tricuspid Valve: The tricuspid valve is normal in structure. Tricuspid valve regurgitation is trivial. No evidence of tricuspid stenosis. Aortic Valve: The aortic valve is tricuspid. There is mild calcification of the aortic valve. Aortic valve regurgitation is not visualized. No aortic stenosis is present. Aortic valve mean gradient measures 2.5 mmHg. Aortic valve peak gradient measures 3.9 mmHg. Aortic valve area, by VTI measures 3.75 cm. Pulmonic Valve: The pulmonic valve was normal in structure. Pulmonic valve regurgitation is trivial. No evidence of pulmonic stenosis. Aorta: Aortic dilatation noted. There is mild dilatation of the aortic root, measuring 40 mm. Venous: The inferior vena cava is normal in size with greater than 50% respiratory variability, suggesting right atrial pressure of 3 mmHg. IAS/Shunts: No atrial level shunt detected by color flow Doppler.  LEFT VENTRICLE PLAX 2D LVIDd:         4.80 cm     Diastology LVIDs:         3.80 cm     LV e' medial:    4.95 cm/s LV PW:         0.80 cm     LV E/e' medial:  9.4 LV IVS:        0.80 cm     LV e' lateral:   8.28 cm/s LVOT diam:     2.50 cm     LV E/e' lateral: 5.6 LV SV:         77 LV SV Index:   38 LVOT Area:     4.91 cm  LV Volumes (MOD) LV vol d, MOD A2C: 79.6 ml LV vol d, MOD A4C: 97.1 ml LV vol s, MOD A2C: 39.5 ml LV vol s, MOD A4C:  46.4 ml LV SV MOD A2C:     40.1 ml LV SV MOD A4C:     97.1 ml LV SV MOD BP:      46.7 ml RIGHT VENTRICLE             IVC RV Basal diam:  2.60 cm      IVC diam: 1.80 cm RV S prime:     12.40 cm/s TAPSE (M-mode): 1.9 cm LEFT ATRIUM             Index        RIGHT ATRIUM           Index LA diam:        2.90 cm 1.43 cm/m   RA Area:     11.70 cm LA Vol (A2C):   31.7 ml 15.64 ml/m  RA Volume:   24.90 ml  12.29 ml/m LA Vol (A4C):   30.7 ml 15.15 ml/m LA Biplane Vol: 31.8 ml 15.69 ml/m  AORTIC VALVE AV Area (Vmax):    3.79 cm AV Area (Vmean):   3.57 cm AV Area (VTI):     3.75 cm AV Vmax:           99.25 cm/s AV Vmean:          74.700 cm/s AV VTI:            0.205 m AV Peak Grad:      3.9 mmHg AV Mean Grad:      2.5 mmHg LVOT Vmax:         76.70 cm/s LVOT Vmean:        54.300 cm/s LVOT VTI:          0.156 m LVOT/AV VTI ratio: 0.76  AORTA Ao Root diam: 4.00 cm Ao Asc diam:  3.40 cm MITRAL VALVE MV Area (PHT): 2.18 cm    SHUNTS MV Area VTI:   4.48 cm    Systemic VTI:  0.16 m MV Peak grad:  1.6 mmHg    Systemic Diam: 2.50 cm MV Mean grad:  0.0 mmHg MV Vmax:       0.63 m/s MV Vmean:      30.4 cm/s MV Decel Time: 348 msec MV E velocity: 46.30 cm/s MV A velocity: 58.90 cm/s MV E/A ratio:  0.79 Arvilla Meres MD Electronically signed by Arvilla Meres MD Signature Date/Time: 01/28/2023/10:43:46 AM    Final    MR BRAIN WO CONTRAST  Result Date: 01/28/2023 CLINICAL DATA:  Slurred speech, facial droop, stroke suspected EXAM: MRI HEAD WITHOUT CONTRAST TECHNIQUE: Multiplanar, multiecho pulse sequences of the brain and surrounding structures were obtained without intravenous contrast. COMPARISON:  No prior MRI available, correlation is made with CTA head neck 01/27/2023 FINDINGS: Brain: No restricted diffusion to suggest acute or subacute infarct. No acute hemorrhage, mass, mass effect, or midline shift. No hydrocephalus or extra-axial collection. Normal pituitary and craniocervical junction. No hemosiderin deposition to suggest remote hemorrhage. Normal cerebral volume for age. Vascular: Normal arterial flow voids. Skull and upper cervical spine: Normal marrow  signal. Sinuses/Orbits: Clear paranasal sinuses. No acute finding in the orbits. Other: The mastoid air cells are well aerated. IMPRESSION: No acute intracranial process. No evidence of acute or subacute infarct. Electronically Signed   By: Wiliam Ke M.D.   On: 01/28/2023 02:01   DG Chest 2 View  Result Date: 01/27/2023 CLINICAL DATA:  Cough. EXAM: CHEST - 2 VIEW COMPARISON:  Radiograph 01/05/2019 FINDINGS: The cardiomediastinal contours are normal. The lungs are clear.  Pulmonary vasculature is normal. No consolidation, pleural effusion, or pneumothorax. No acute osseous abnormalities are seen. IMPRESSION: No acute findings or explanation for cough. Electronically Signed   By: Narda Rutherford M.D.   On: 01/27/2023 22:06   CT ANGIO HEAD NECK W WO CM  Result Date: 01/27/2023 CLINICAL DATA:  Slurred speech EXAM: CT ANGIOGRAPHY HEAD AND NECK WITH AND WITHOUT CONTRAST TECHNIQUE: Multidetector CT imaging of the head and neck was performed using the standard protocol during bolus administration of intravenous contrast. Multiplanar CT image reconstructions and MIPs were obtained to evaluate the vascular anatomy. Carotid stenosis measurements (when applicable) are obtained utilizing NASCET criteria, using the distal internal carotid diameter as the denominator. RADIATION DOSE REDUCTION: This exam was performed according to the departmental dose-optimization program which includes automated exposure control, adjustment of the mA and/or kV according to patient size and/or use of iterative reconstruction technique. CONTRAST:  80mL OMNIPAQUE IOHEXOL 350 MG/ML SOLN COMPARISON:  None Available. FINDINGS: CT HEAD FINDINGS Brain: No evidence of acute infarction, hemorrhage, hydrocephalus, extra-axial collection or mass lesion/mass effect. Sequela of mild chronic microvascular ischemic change. Vascular: See below Skull: Normal. Negative for fracture or focal lesion. Sinuses/Orbits: No middle ear or mastoid effusion.  Paranasal sinuses are clear. Orbits are unremarkable. Other: None. Review of the MIP images confirms the above findings CTA NECK FINDINGS Aortic arch: Standard branching. Imaged portion shows no evidence of aneurysm or dissection. No significant stenosis of the major arch vessel origins. Right carotid system: No evidence of dissection, stenosis (50% or greater), or occlusion. Left carotid system: No evidence of dissection, stenosis (50% or greater), or occlusion. Vertebral arteries: Codominant. No evidence of dissection, stenosis (50% or greater), or occlusion. Skeleton: Negative. Other neck: Negative. Upper chest: Negative. Review of the MIP images confirms the above findings CTA HEAD FINDINGS Anterior circulation: No significant stenosis, proximal occlusion, aneurysm, or vascular malformation. Posterior circulation: No significant stenosis, proximal occlusion, aneurysm, or vascular malformation. Venous sinuses: As permitted by contrast timing, patent. Anatomic variants: None Review of the MIP images confirms the above findings IMPRESSION: 1. No hemorrhage or CT evidence of an acute cortical infarct 2. No intracranial large vessel occlusion or significant stenosis. 3. No hemodynamically significant stenosis in the neck. Electronically Signed   By: Lorenza Cambridge M.D.   On: 01/27/2023 11:24    Labs: BNP (last 3 results) No results for input(s): "BNP" in the last 8760 hours. Basic Metabolic Panel: Recent Labs  Lab 01/27/23 0955 01/28/23 0249  NA 135 136  K 3.6 3.5  CL 100 103  CO2 24 24  GLUCOSE 110* 102*  BUN 13 12  CREATININE 1.15 1.16  CALCIUM 9.1 9.2   Liver Function Tests: Recent Labs  Lab 01/27/23 0955  AST 35  ALT 37  ALKPHOS 64  BILITOT 0.8  PROT 7.4  ALBUMIN 4.3   No results for input(s): "LIPASE", "AMYLASE" in the last 168 hours. No results for input(s): "AMMONIA" in the last 168 hours. CBC: Recent Labs  Lab 01/27/23 0955 01/28/23 0249  WBC 3.5* 4.0  NEUTROABS 1.7 1.8   HGB 15.2 15.3  HCT 43.3 44.1  MCV 85.7 86.1  PLT 197 190   Cardiac Enzymes: No results for input(s): "CKTOTAL", "CKMB", "CKMBINDEX", "TROPONINI" in the last 168 hours. BNP: Invalid input(s): "POCBNP" CBG: Recent Labs  Lab 01/27/23 0933 01/28/23 1122  GLUCAP 109* 155*   D-Dimer No results for input(s): "DDIMER" in the last 72 hours. Hgb A1c Recent Labs    01/27/23 2022  HGBA1C 5.9*   Lipid Profile Recent Labs    01/27/23 2022  CHOL 233*  HDL 64  LDLCALC 142*  TRIG 133  CHOLHDL 3.6   Thyroid function studies Recent Labs    01/27/23 2022  TSH 1.991   Anemia work up No results for input(s): "VITAMINB12", "FOLATE", "FERRITIN", "TIBC", "IRON", "RETICCTPCT" in the last 72 hours. Urinalysis    Component Value Date/Time   COLORURINE YELLOW 01/27/2023 0948   APPEARANCEUR CLEAR 01/27/2023 0948   LABSPEC 1.010 01/27/2023 0948   PHURINE 5.5 01/27/2023 0948   GLUCOSEU NEGATIVE 01/27/2023 0948   HGBUR NEGATIVE 01/27/2023 0948   BILIRUBINUR NEGATIVE 01/27/2023 0948   KETONESUR NEGATIVE 01/27/2023 0948   PROTEINUR NEGATIVE 01/27/2023 0948   NITRITE NEGATIVE 01/27/2023 0948   LEUKOCYTESUR NEGATIVE 01/27/2023 0948   Sepsis Labs Recent Labs  Lab 01/27/23 0955 01/28/23 0249  WBC 3.5* 4.0   Microbiology No results found for this or any previous visit (from the past 240 hour(s)).  Time coordinating discharge: 25 minutes  SIGNED: Lanae Boast, MD  [facility] 01/28/2023, 3:04 PM  If 7PM-7AM, please contact night-coverage [email or web address]

## 2023-01-28 NOTE — Plan of Care (Signed)
  Problem: Education: Goal: Knowledge of disease or condition will improve Outcome: Progressing   Problem: Ischemic Stroke/TIA Tissue Perfusion: Goal: Complications of ischemic stroke/TIA will be minimized Outcome: Progressing   Problem: Health Behavior/Discharge Planning: Goal: Ability to manage health-related needs will improve Outcome: Progressing Goal: Goals will be collaboratively established with patient/family Outcome: Progressing   Problem: Self-Care: Goal: Ability to participate in self-care as condition permits will improve Outcome: Progressing   

## 2023-01-28 NOTE — Progress Notes (Signed)
Brief Nutrition Note   New consult received for patient with TIA. Visited pt in room, current eating his breakfast. Well nourished on exam. Pt reports a good appetite prior to admission and that he has not had any unintentional weight loss. Does report that he is interested in making diet changes to reduce processed foods and fast food consumption. Agreeable to outpatient RD referral and will add education to AVS.  Wt Readings from Last 15 Encounters:  01/27/23 86.7 kg  09/09/21 88.9 kg  01/05/19 81.2 kg   Body mass index is 28.23 kg/m.   Current diet order is [employer].  No nutrition interventions warranted at this time. If acute nutrition issues arise, please consult RD.   Greig Castilla, RD, LDN Clinical Dietitian RD pager # available in AMION  After hours/weekend pager # available in [facility]

## 2023-01-28 NOTE — Hospital Course (Addendum)
66 y.o.m w/ colitis, gastric and duodenal ulcers, and HTN presented with aphasia.He reported that he went ot bed about 1130 01/26/23 woke next am and was noticing some breathing problems from his nose. He was trying to talk to his wife and it was slurred.  Left side of his mouth was drooping.  He took 2 ibuprofen and then spilled all of his medication on the floor, clumsy but not clearly weak.  He was able to shave and shower without difficulty and seemed to be better after.  After 30 minutes, he still had a mild droop but was able to speak normally.  Droop resolved about 15 minutes later.    ER Course:  [facility] transfer and being admitted for 1 hour of slowed and slurred speech, left facial droop, clumsiness of the hands with negative workup for possible TIA-completed stroke workup see below,.  Once cleared by neurology patient will be discharged home

## 2023-01-28 NOTE — Progress Notes (Signed)
PT Cancellation Note  Patient Details Name: Keith Brewer MRN: [id number] DOB: 02-07-1956   Cancelled Treatment:    Reason Eval/Treat Not Completed: Patient at procedure or test/unavailable  Finishing up in-room procedure/test. Will check back shortly for PT assessment.  Berton Mount 01/28/2023, 9:34 AM

## 2023-01-28 NOTE — Progress Notes (Signed)
Discharge medications from [facility] pharmacy handed to patient.

## 2023-01-28 NOTE — Progress Notes (Addendum)
STROKE TEAM PROGRESS NOTE   INTERVAL HISTORY No family is present at bedside.  Patient is awake, alert and in no acute distress.  There are no residual or new focal neuro deficits.  Vital signs are stable.  Patient reports desire to implement lifestyle changes including diet and exercise to improve his health.  MRI negative for acute intracranial abnormality. LDL above goal, discussed with patient who understands importance of diet, lifestyle modifications, and atorvastatin.  Vitals:   01/27/23 1610 01/27/23 1700 01/28/23 0418 01/28/23 0828  BP: (!) 165/91 (!) 142/88 117/75 128/74  Pulse: 70 64 (!) 59 64  Resp: 13 15  18   Temp:  97.9 F (36.6 C) 98.2 F (36.8 C) 97.8 F (36.6 C)  TempSrc:  Oral Oral Axillary  SpO2: 98% 99% 98% 98%  Weight:      Height:       CBC:  Recent Labs  Lab 01/27/23 0955 01/28/23 0249  WBC 3.5* 4.0  NEUTROABS 1.7 1.8  HGB 15.2 15.3  HCT 43.3 44.1  MCV 85.7 86.1  PLT 197 190   Basic Metabolic Panel:  Recent Labs  Lab 01/27/23 0955 01/28/23 0249  NA 135 136  K 3.6 3.5  CL 100 103  CO2 24 24  GLUCOSE 110* 102*  BUN 13 12  CREATININE 1.15 1.16  CALCIUM 9.1 9.2   Lipid Panel:  Recent Labs  Lab 01/27/23 2022  CHOL 233*  TRIG 133  HDL 64  CHOLHDL 3.6  VLDL 27  LDLCALC 161*   HgbA1c:  Recent Labs  Lab 01/27/23 2022  HGBA1C 5.9*   Urine Drug Screen:  Recent Labs  Lab 01/27/23 0948  LABOPIA NONE DETECTED  COCAINSCRNUR NONE DETECTED  LABBENZ NONE DETECTED  AMPHETMU NONE DETECTED  THCU NONE DETECTED  LABBARB NONE DETECTED    Alcohol Level  Recent Labs  Lab 01/27/23 0955  ETH <10    IMAGING past 24 hours MR BRAIN WO CONTRAST  Result Date: 01/28/2023 CLINICAL DATA:  Slurred speech, facial droop, stroke suspected EXAM: MRI HEAD WITHOUT CONTRAST TECHNIQUE: Multiplanar, multiecho pulse sequences of the brain and surrounding structures were obtained without intravenous contrast. COMPARISON:  No prior MRI available,  correlation is made with CTA head neck 01/27/2023 FINDINGS: Brain: No restricted diffusion to suggest acute or subacute infarct. No acute hemorrhage, mass, mass effect, or midline shift. No hydrocephalus or extra-axial collection. Normal pituitary and craniocervical junction. No hemosiderin deposition to suggest remote hemorrhage. Normal cerebral volume for age. Vascular: Normal arterial flow voids. Skull and upper cervical spine: Normal marrow signal. Sinuses/Orbits: Clear paranasal sinuses. No acute finding in the orbits. Other: The mastoid air cells are well aerated. IMPRESSION: No acute intracranial process. No evidence of acute or subacute infarct. Electronically Signed   By: Wiliam Ke M.D.   On: 01/28/2023 02:01   DG Chest 2 View  Result Date: 01/27/2023 CLINICAL DATA:  Cough. EXAM: CHEST - 2 VIEW COMPARISON:  Radiograph 01/05/2019 FINDINGS: The cardiomediastinal contours are normal. The lungs are clear. Pulmonary vasculature is normal. No consolidation, pleural effusion, or pneumothorax. No acute osseous abnormalities are seen. IMPRESSION: No acute findings or explanation for cough. Electronically Signed   By: Narda Rutherford M.D.   On: 01/27/2023 22:06   CT ANGIO HEAD NECK W WO CM  Result Date: 01/27/2023 CLINICAL DATA:  Slurred speech EXAM: CT ANGIOGRAPHY HEAD AND NECK WITH AND WITHOUT CONTRAST TECHNIQUE: Multidetector CT imaging of the head and neck was performed using the standard protocol during bolus administration  of intravenous contrast. Multiplanar CT image reconstructions and MIPs were obtained to evaluate the vascular anatomy. Carotid stenosis measurements (when applicable) are obtained utilizing NASCET criteria, using the distal internal carotid diameter as the denominator. RADIATION DOSE REDUCTION: This exam was performed according to the departmental dose-optimization program which includes automated exposure control, adjustment of the mA and/or kV according to patient size and/or use  of iterative reconstruction technique. CONTRAST:  80mL OMNIPAQUE IOHEXOL 350 MG/ML SOLN COMPARISON:  None Available. FINDINGS: CT HEAD FINDINGS Brain: No evidence of acute infarction, hemorrhage, hydrocephalus, extra-axial collection or mass lesion/mass effect. Sequela of mild chronic microvascular ischemic change. Vascular: See below Skull: Normal. Negative for fracture or focal lesion. Sinuses/Orbits: No middle ear or mastoid effusion. Paranasal sinuses are clear. Orbits are unremarkable. Other: None. Review of the MIP images confirms the above findings CTA NECK FINDINGS Aortic arch: Standard branching. Imaged portion shows no evidence of aneurysm or dissection. No significant stenosis of the major arch vessel origins. Right carotid system: No evidence of dissection, stenosis (50% or greater), or occlusion. Left carotid system: No evidence of dissection, stenosis (50% or greater), or occlusion. Vertebral arteries: Codominant. No evidence of dissection, stenosis (50% or greater), or occlusion. Skeleton: Negative. Other neck: Negative. Upper chest: Negative. Review of the MIP images confirms the above findings CTA HEAD FINDINGS Anterior circulation: No significant stenosis, proximal occlusion, aneurysm, or vascular malformation. Posterior circulation: No significant stenosis, proximal occlusion, aneurysm, or vascular malformation. Venous sinuses: As permitted by contrast timing, patent. Anatomic variants: None Review of the MIP images confirms the above findings IMPRESSION: 1. No hemorrhage or CT evidence of an acute cortical infarct 2. No intracranial large vessel occlusion or significant stenosis. 3. No hemodynamically significant stenosis in the neck. Electronically Signed   By: Lorenza Cambridge M.D.   On: 01/27/2023 11:24    PHYSICAL EXAM Constitutional: Appears well-developed and well-nourished.  Psych: Affect appropriate to situation, calm and cooperative with exam Eyes: No scleral injection HENT: No OP  obstrucion MSK: no joint deformities or swelling Cardiovascular: Normal rate and regular rhythm.  Respiratory: Effort normal, non-labored breathing GI: Soft.  No distension. There is no tenderness.  Skin: WDI  Neuro: Mental Status: Patient is awake, alert, oriented to person, place, month, year, and situation. Patient is able to give a clear and coherent history. No signs of aphasia or neglect Cranial Nerves: II: Visual Fields are full. Pupils are equal, round, and reactive to light.   III,IV, VI: EOMI without ptosis or diploplia.  V: Facial sensation is intact and symmetric to light touch VII: Facial movement is symmetric resting and with movement VIII: Hearing is intact to voice X: Palate elevates symmetrically XI: Shoulder shrug is symmetric. XII: Tongue protrudes midline without atrophy or fasciculations.  Motor: Tone is normal. Bulk is normal. 5/5 strength was present in all four extremities.  Sensory: Sensation is symmetric to light touch and temperature in the arms and legs. No extinction to DSS present.  Cerebellar: FNF and HKS are intact bilaterally  ASSESSMENT/PLAN Mr. Keith Brewer is a 67 y.o. male with history of HTN presenting to [facility] with transient slurred/slowed speech, left facial droop, extremity weakness.  Symptoms had resolved after approximately 30 minutes of onset.  Patient is not a candidate for TNK as he had no focal deficit on arrival and patient presented outside of the thrombolytic therapy time window.  Patient was evaluated by teleneurology and transferred to [facility] for TIA workup.  TIA: Likely right-sided TIA etiology likely small vessel disease  CT head + CTA head and neck 01/27/23: No hemorrhage or CT evidence of acute cortical infarct.  No LVO or significant stenosis.  No hemodynamically significant stenosis in the neck. MRI no acute intracranial process.  No evidence of acute or subacute infarct. 2D Echo LVEF 50-55%. LDL 142 HgbA1c 5.9 UDS neg VTE  prophylaxis -Lovenox subcu No antithrombotic prior to admission, now on aspirin 81 mg daily and clopidogrel 75 mg daily for 3 weeks followed by aspirin monotherapy Therapy recommendations:  None Disposition:  home  Hypertension Home meds:  amlodipine  Stable Long-term BP goal normotensive  Hyperlipidemia Home meds:  none LDL 142, goal < 70 Add atorvastatin 40 mg daily  Continue statin at discharge  Other Stroke Risk Factors Advanced Age >/= 76  Family hx stroke (patient's mother)  Other Active Problems Mild leukopenia, WBC 3.5  Hospital day # 0  Lanae Boast, AGACNP-BC [facility] Pager: [phone]  To contact Stroke Continuity provider, please refer to [email or web address]. After hours, contact General Neurology

## 2023-01-28 NOTE — Progress Notes (Signed)
Physical Therapy Note  Subjectively reports full resolution of symptoms. BERG balance test 54/56 (low fall risk.) Strength 5/5 throughout. SILT. Normal heel-to-shin test. 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 PT is signing-off. Please re-order if there is any significant change in status. Thank you for this referral.  Kathlyn Sacramento, PT, DPT [facility]  Rehabilitation Services Physical Therapist Office: [phone] Website: [email or web address]

## 2023-01-28 NOTE — Progress Notes (Signed)
OT Screen Note  Patient Details Name: Keith Brewer MRN: [id number] DOB: [birth year]   Cancelled Treatment:    Reason Eval/Treat Not Completed: OT screened, no needs identified, will sign off (screened after discussion with PT)  Mateo Flow 01/28/2023, 10:25 AM

## 2023-01-28 NOTE — TOC Transition Note (Signed)
Transition of Care [facility]) - CM/SW Discharge Note   Patient Details  Name: Keith Brewer MRN: [id number] Date of Birth: March 27, 1956  [facility] CM/SW Contact:  Kermit Balo, RN Phone Number: 01/28/2023, 3:10 PM   Clinical Narrative:    Pt is discharging home with self care. No needs per therapies.  Medications for home to be delivered to the room per [facility] pharmacy.  Pt has transport home.   Final next level of care: Home/Self Care Barriers to Discharge: No Barriers Identified   Patient Goals and CMS Choice      Discharge Placement                         Discharge Plan and Services Additional resources added to the After Visit Summary for                                       Social Determinants of Health (SDOH) Interventions SDOH Screenings   Food Insecurity: No Food Insecurity (01/27/2023)  Housing: Low Risk  (01/27/2023)  Transportation Needs: No Transportation Needs (01/27/2023)  Utilities: Not At Risk (01/27/2023)  Tobacco Use: Low Risk  (01/27/2023)     Readmission Risk Interventions     No data to display

## 2023-01-28 NOTE — Discharge Instructions (Signed)
Heart-Healthy Nutrition Therapy  A heart-healthy diet is recommended to reduce your unhealthy blood cholesterol levels, manage high blood pressure, and lower your risk for heart disease. To follow a heart-healthy diet, Eat a balanced diet with whole grains, fruits and vegetables, and lean protein sources. Achieve and maintain a healthy weight. Choose heart-healthy unsaturated fats. Limit saturated fats, trans fats, and cholesterol intake. Eat more plant-based or vegetarian meals using beans and soy foods for protein. Eat whole, unprocessed foods to limit the amount of sodium (salt) you eat. Limit refined carbohydrates especially sugar, sweets and sugar-sweetened beverages. If you drink alcohol, do so in moderation: one serving per day (women) and two servings per day (men). One serving is equivalent to 12 ounces beer, 5 ounces wine, or 1.5 ounces distilled spirits  Tips Tips for Choosing Heart-Healthy Fats Choose lean protein and low-fat dairy foods to reduce saturated fat intake. Saturated fat is usually found in animal-based protein and is associated with certain health risks. Saturated fat is the biggest contributor to raised low-density lipoprotein (LDL) cholesterol levels in the diet. Research shows that limiting saturated fat lowers unhealthy cholesterol levels. Eat no more than 5-6% of your total calories each day from saturated fat. Ask your registered dietitian nutritionist (RDN) to help you determine how much saturated fat is right for you. There are many foods that do not contain large amounts of saturated fats. Swapping these foods to replace foods high in saturated fats will help you limit the saturated fat you eat and improve your cholesterol levels. You can also try eating more plant-based or vegetarian meals. Instead of. Try:  Whole milk, cheese, yogurt, and ice cream 1%, %, or skim milk, low-fat cheese, non-fat yogurt, and low-fat ice cream  Fatty, marbled beef and pork Lean  beef, pork, or venison  Poultry with skin Poultry without skin  Butter, stick margarine Reduced-fat, whipped, or liquid spreads  Coconut oil, palm oil Liquid vegetable oils: corn, canola, olive, soybean and safflower oils   Avoid trans fats. Trans fats increase levels of LDL-cholesterol. Hydrogenated fat in processed foods is the main source of trans fats in foods.  Trans fats can be found in stick margarine, shortening, processed sweets, baked goods, some fried foods, and packaged foods made with hydrogenated oils. Avoid foods with "partially hydrogenated oil" on the ingredient list such as: cookies, pastries, baked goods, biscuits, crackers, microwave popcorn, and frozen dinners. Choose foods with heart healthy fats. Polyunsaturated and monounsaturated fat are unsaturated fats that may help lower your blood cholesterol level when used in place of saturated fat in your diet. Ask your RDN about taking a dietary supplement with plant sterols and stanols to help lower your cholesterol level. Research shows that substituting saturated fats with unsaturated fats is beneficial to cholesterol levels. Try these easy swaps:   Instead of. Try:  Butter, stick margarine, or solid shortening Reduced-fat, whipped, or liquid spreads  Beef, pork, or poultry with skin   Fish and seafood  Chips, crackers, snack foods Raw or unsalted nuts and seeds or nut butters Hummus with vegetables Avocado on toast  Coconut oil, palm oil Liquid vegetable oils: corn, canola, olive, soybean and safflower oils    Limit the amount of cholesterol you eat to less than 200 milligrams per day. Cholesterol is a substance carried through the bloodstream via lipoproteins, which are known as "transporters" of fat. Some body functions need cholesterol to work properly, but too much cholesterol in the bloodstream can damage arteries and build up blood   vessel linings (which can lead to heart attack and stroke). You should eat less than  200 milligrams cholesterol per day. People respond differently to eating cholesterol. There is no test available right now that can figure out which people will respond more to dietary cholesterol and which will respond less. For individuals with high intake of dietary cholesterol, different types of increase (none, small, moderate, large) in LDL-cholesterol levels are all possible.   Food sources of cholesterol include egg yolks and organ meats such as liver, gizzards.  Limit egg yolks to two to four per week and avoid organ meats like liver and gizzards to control cholesterol intake.  Tips for Choosing Heart-Healthy Carbohydrates Consume foods rich in viscous (soluble) fiber Viscous, or soluble, fiber is found in the walls of plant cells. Viscous fiber is found only in plant-based foods--animal-based foods like meat or dairy products do not contain fiber. In the stomach, viscous fibers absorb water and swell to form a thick, jelly-like mass. This helps to lower your unhealthy cholesterol Rich sources of viscous fiber include asparagus, Brussels sprouts, sweet potatoes, turnips, apricots, mangoes, oranges, legumes, barley, oats, and oat bran. Eat at least 5 to 10 grams of viscous fiber each day. As you increase your fiber intake gradually, also increase the amount of water you drink. This will help prevent constipation. If you have difficulty achieving this goal, ask your RDN about fiber laxatives. Choose fiber supplements made with viscous fibers such as psyllium seed husks or methylcellulose to help lower unhealthy cholesterol. Limit refined carbohydrates There are three types of carbohydrates: starches, sugar, and fiber. Some carbohydrates occur naturally in food, like the starches in rice or corn or the sugars in fruits and milk. Refined carbohydrates--foods with high amounts of simple sugars--can raise triglyceride levels. High triglyceride levels are associated with coronary heart disease. Some  examples of refined carbohydrate foods are table sugar, sweets, and beverages sweetened with added sugar. Tips for Reducing Sodium (Salt) Although sodium is important for your body to function, too much sodium can be harmful for people with high blood pressure.  As sodium and fluid buildup in your tissues and bloodstream, your blood pressure increases. High blood pressure may cause damage to other organs and increase your risk for a stroke. Keep your salt intake to 2300 milligrams or less per day. Even if you take a pill for blood pressure or a water pill (diuretic) to remove fluid, it is still important to have less salt in your diet. Ask your RDN what amount of sodium is right for you. Avoid processed foods.  Eat more fresh foods. Fresh fruits and vegetables are naturally low in sodium, as well as frozen vegetables and fruits that have no added juices or sauces. Fresh meats are lower in sodium than processed meats, such as bacon, sausage, and hotdogs.  Read the nutrition label or ask your butcher to help you find a fresh meat that is low in sodium. Eat less salt--at the table and when cooking. A single teaspoon of table salt has 2,300 mg of sodium. Leave the salt out of recipes for pasta, casseroles, and soups. Ask your RDN how to cook your favorite recipes without sodium Be a smart shopper. Look for food packages that say "salt-free" or "sodium-free." These items contain less than 5 milligrams of sodium per serving. "Very low-sodium" products contain less than 35 milligrams of sodium per serving. "Low-sodium" products contain less than 140 milligrams of sodium per serving.  Beware of "reduced salt" or "reduced   sodium" products.  These items may still be high in sodium. Check the nutrition label.  Add flavors to your food without adding sodium. Try lemon juice, lime juice, fruit juice or vinegar.   Dry or fresh herbs add flavor. Try basil, bay leaf, dill, rosemary, parsley, sage, dry mustard,  nutmeg, thyme, and paprika. Pepper, red pepper flakes, and cayenne pepper can add spice to your meals without adding sodium. Hot sauce contains sodium, but if you use just a drop or two, it will not add up to much. Buy a sodium-free seasoning blend or make your own at home.    Additional Lifestyle Tips Achieve and maintain a healthy weight. Talk with your RDN or your doctor about what is a healthy weight for you. Set goals to reach and maintain that weight.  To lose weight, reduce your calorie intake along with increasing your physical activity. A weight loss of 10 to 15 pounds could reduce LDL-cholesterol by 5 milligrams per deciliter. Participate in physical activity. Talk with your health care team to find out what types of physical activity are best for you. Set a plan to get about 30 minutes of exercise on most days.   Foods to Choose or to Limit Food Group Foods to Choose Foods to Limit  Grains Whole grain breads and cereals, including whole wheat, barley, rye, buckwheat, corn, teff, quinoa, millet, amaranth, brown or wild rice, sorghum, and oats Pasta, especially whole wheat or other whole grain types Brown rice, quinoa or wild rice Whole grain crackers, bread, rolls, pitas Home-made bread with reduced-sodium baking soda Breads or crackers topped with salt Cereals (hot or cold) with more than 300 mg sodium per serving Biscuits, cornbread, and other "quick" breads prepared with baking soda Bread crumbs or stuffing mix from a store High-fat bakery products, such as doughnuts, biscuits, croissants, danish pastries, pies, cookies Instant cooking foods to which you add hot water and stir--potatoes, noodles, rice, etc. Packaged starchy foods--seasoned noodle or rice dishes, stuffing mix, macaroni and cheese dinner Snacks made with partially hydrogenated oils, including chips, cheese puffs, snack mixes, regular crackers, butter-flavored popcorn  Protein Foods Lean cuts of beef and pork  (loin, leg, round, extra lean hamburger) Skinless poultry Fish Venison and other wild game Dried beans and peas Nuts and nut butters (unsalted) Seeds and seed butters (unsalted) Meat alternatives made with soy or textured vegetable protein Egg whites or egg substitute Cold cuts made with lean meat or soy protein Higher-fat cuts of meats (ribs, t-bone steak, regular hamburger) Bacon, sausage, or hot dogs Cold cuts, such as salami or bologna, deli meats, cured meats, corned beef Organ meats (liver, brains, gizzards, sweetbreads) Poultry with skin Fried or smoked meat, poultry, and fish Whole eggs and egg yolks (more than 2-4 per week) Salted legumes, nuts, seeds, or nut/seed butters Meat alternatives with high levels of sodium (>300 mg per serving) or saturated fat (>5 g per serving)  Dairy and Dairy Alternatives Nonfat (skim), low-fat, or 1%-fat milk Nonfat or low-fat yogurt or cottage cheese Fat-free and low-fat cheese Fortified non-dairy milk: almond, cashew, pea, and soy Whole milk, 2% fat milk, buttermilk Whole milk yogurt or ice cream Cream, half-&-half Cream cheese Sour cream Cheese  Vegetables Fresh, frozen, or canned vegetables without added fat or salt Avocados Canned or frozen vegetables with salt, fresh vegetables prepared with salt, butter, cheese, or cream sauce Fried vegetables Pickled vegetables such as olives, pickles, or sauerkraut  Fruits Fresh, frozen, canned, or dried fruit Fried fruits Fruits   served with butter or cream  Oils Unsaturated oils (corn, olive, peanut, soy, sunflower, canola) Soft or liquid margarines and vegetable oil spreads Salad dressings made from saturated fats Butter, stick margarine, shortening Partially hydrogenated oils or trans fats Tropical oils (coconut, palm, palm kernel oils)  Other Prepared or homemade foods, including soups, casseroles, and salads made from recommended ingredients and contain <600 mg sodium. Low-sodium seasonings  (ketchup, barbeque sauce) Spices, herbs, Salt-free seasoning mixes and marinades Vinegar Lemon or lime juice Prepared foods, including soups, casseroles, and salads made from recommended ingredients and contain >600 mg sodium. Frozen meals and prepared sides that are >600 mg of sodium Sugary and/or fatty desserts, candy, and other sweets Salts:  sea salt, kosher salt, onion salt, and garlic salt, seasoning mixes containing salt Flavorings: bouillon cubes, catsup or ketchup, barbeque sauce, Worcestershire sauce, soy sauce, salsa, relish, teriyaki sauce   Heart-Healthy Sample 1-Day Menu View Nutrient Info Breakfast 1 cup oatmeal 1 cup fat-free milk 1 cup blueberries 1 ounce almonds, unsalted 1 cup brewed coffee  Lunch 2 slices whole-wheat bread 2 ounces lean turkey breast 1 ounce low-fat Swiss cheese 2 slices tomato 2 lettuce leaves 1 pear 1 cup skim milk  Afternoon Snack 1 ounce trail mix with unsalted nuts, seeds, and raisins  Evening Meal 3 ounces broiled salmon 2/3 cup brown rice 1 teaspoon margarine, soft tub  cup broccoli, cooked  cup carrots, cooked 1 cup tossed salad 1 teaspoon olive oil and vinegar dressing 1 small whole-wheat roll 1 teaspoon margarine, soft tub 1 cup tea  Evening Snack 1 small banana  Daily Sum Nutrient Unit Value  Macronutrients  Energy kcal 2069  Energy kJ 8655  Protein g 146  Total lipid (fat) g 69  Carbohydrate, by difference g 228  Fiber, total dietary g 33  Sugars, total g 85  Minerals  Calcium, Ca mg 1292  Iron, Fe mg 14  Sodium, Na mg 1751  Vitamins  Vitamin C, total ascorbic acid mg 85  Vitamin A, IU IU 17756  Vitamin D IU 231  Lipids  Fatty acids, total saturated g 12  Fatty acids, total monounsaturated g 27  Fatty acids, total polyunsaturated g 24  Cholesterol mg 270     Heart-Healthy Vegan 1-Day Sample Menu View Nutrient Info Breakfast 1 slice whole wheat toast 2 tablespoons peanut butter without salt Tofu scramble  made with:  cup calcium-set tofu  cup green pepper  cup spinach  cup tomatoes  cup white mushrooms 1 teaspoon canola oil 1 cup soymilk fortified with calcium, vitamin B12, and vitamin D  Lunch 1 cup reduced sodium split pea soup 1 whole wheat dinner roll 1 medium apple  Dinner Salad made with: 1 cup lentils  cup cooked broccoli  cup cooked carrots 2 tablespoons hummus 1 cup sliced strawberries 1 cup soymilk fortified with calcium, vitamin B12, and vitamin D  Evening Snack 1 cup soy yogurt  cup mixed nuts  Daily Sum Nutrient Unit Value  Macronutrients  Energy kcal 1672  Energy kJ 7000  Protein g 86  Total lipid (fat) g 65  Carbohydrate, by difference g 205  Fiber, total dietary g 47  Sugars, total g 73  Minerals  Calcium, Ca mg 1443  Iron, Fe mg 19  Sodium, Na mg 1148  Vitamins  Vitamin C, total ascorbic acid mg 253  Vitamin A, IU IU 15451  Vitamin D IU 361  Lipids  Fatty acids, total saturated g 11  Fatty acids, total monounsaturated g   29  Fatty acids, total polyunsaturated g 19  Cholesterol mg 5     Heart-Healthy Vegetarian (Lacto-Ovo) Sample 1-Day Menu View Nutrient Info Breakfast 1 cup cooked oatmeal 1 tablespoon ground flaxseed 1 cup blueberries 2 scrambled egg whites with 1 teaspoon canola oil 2 tablespoons salsa 1 cup fat-free milk 1 cup coffee Oil, canola  Lunch 2 slices whole wheat bread 2 tablespoons peanut butter without salt 1 small banana 6 ounces fat-free plain yogurt 1 cup sliced red pepper 2 tablespoons hummus 1 cup fat-free milk  Evening Meal Stir fry made with:  cup tofu 1 cup brown rice  cup cooked broccoli  cup cooked carrots  cup cooked green beans 1 teaspoon peanut oil  Evening Snack 1 slice low-fat mozzarella cheese 1 medium apple  Daily Sum Nutrient Unit Value  Macronutrients  Energy kcal 1764  Energy kJ 7375  Protein g 87  Total lipid (fat) g 53  Carbohydrate, by difference g 254  Fiber, total dietary g 35   Sugars, total g 98  Minerals  Calcium, Ca mg 1609  Iron, Fe mg 12  Sodium, Na mg 1434  Vitamins  Vitamin C, total ascorbic acid mg 210  Vitamin A, IU IU 16317  Vitamin D IU 234  Lipids  Fatty acids, total saturated g 12  Fatty acids, total monounsaturated g 21  Fatty acids, total polyunsaturated g 15  Cholesterol mg 31    Copyright 2020  [employer]. All rights reserved  

## 2023-02-17 ENCOUNTER — Telehealth: Payer: Self-pay | Admitting: Family Medicine

## 2023-02-17 NOTE — Telephone Encounter (Signed)
Pt is scheduled with Amy for 8/6 and would like to know if he could have a refill for clopidogrel (PLAVIX) 75 MG tablet in the meantime

## 2023-02-17 NOTE — Telephone Encounter (Signed)
Pt was called, message was relayed to him.

## 2023-02-17 NOTE — Telephone Encounter (Signed)
Pt informed with Dr.Camara message.

## 2023-02-17 NOTE — Telephone Encounter (Signed)
The Plavix was for only 21 days (TIA diagnosis) no need for refill. He should continue with Aspirin alone

## 2023-02-17 NOTE — Telephone Encounter (Signed)
Please call patient and relay Amy has never seen him, she can't refill Rx for him. He can check with PCP Dr.Stroud to refill for him.

## 2023-02-17 NOTE — Telephone Encounter (Addendum)
Dr. Teresa Coombs you are work in, pt scheduled to see Amy on 02/24/23, I was told to check with you to see if you are willing to refill Plavix 75 mg tablet? Pt was seen in hospital for TIA on 01/27/23 with Dr. Roda Shutters.  Please advise

## 2023-02-23 NOTE — Progress Notes (Deleted)
[facility] [address] [place]. [place] [address] [phone]       HOSPITAL FOLLOW UP NOTE  Mr. Keith Brewer Date of Birth:  15-Oct-1955 Medical Record Number:  [id number]   Reason for Referral:  hospital stroke follow up    SUBJECTIVE:   CHIEF COMPLAINT:  No chief complaint on file.   HPI:   Keith Brewer is a 67 y.o. who  has a past medical history of Colitis, Duodenal ulcer, Gastric ulcer, GERD (gastroesophageal reflux disease), Hypertension, and Internal hemorrhoids.  Patient presented to [facility] on 01/27/2023 with left facial droop, clumsiness and slurred speech. He reported that he went ot bed about 1130 01/26/23 woke next am and was noticing some breathing problems from his nose. He was trying to talk to his wife and it was slurred. Left side of his mouth was drooping. He took 2 ibuprofen and then spilled all of his medication on the floor, clumsy but not clearly weak. He was able to shave and shower without difficulty and seemed to be better after. After 30 minutes, he still had a mild droop but was able to speak normally. Droop resolved about 15 minutes later. CT/CTA negative. MRI unremarkable. LDL 142. A1C 5.9. He was started on asa 81mg  and Plavix for 3 weeks then asa alone. Started on atorvastatin 40mg  daily. No therapy recommendations. He was discharged home. Personally reviewed hospitalization pertinent progress notes, lab work and imaging.  Evaluated by Dr Roda Shutters.    BP amlodipine   He continues atorvastatin 40mg  and asa 81mg  daily.    PERTINENT IMAGING/LABS  CT head + CTA head and neck 01/27/23: No hemorrhage or CT evidence of acute cortical infarct.  No LVO or significant stenosis.  No hemodynamically significant stenosis in the neck. MRI no acute intracranial process.  No evidence of acute or subacute infarct. 2D Echo LVEF 50-55%.   A1C Lab Results  Component Value Date   HGBA1C 5.9 (H) 01/27/2023    Lipid Panel     Component Value  Date/Time   CHOL 233 (H) 01/27/2023 2022   TRIG 133 01/27/2023 2022   HDL 64 01/27/2023 2022   CHOLHDL 3.6 01/27/2023 2022   VLDL 27 01/27/2023 2022   LDLCALC 142 (H) 01/27/2023 2022      ROS:   14 system review of systems performed and negative with exception of those listed in HPI  PMH:  Past Medical History:  Diagnosis Date   Colitis    Duodenal ulcer    Gastric ulcer    GERD (gastroesophageal reflux disease)    Hypertension    Internal hemorrhoids     PSH:  Past Surgical History:  Procedure Laterality Date   EYE SURGERY      Social History:  Social History   Socioeconomic History   Marital status: Married    Spouse name: Not on file   Number of children: Not on file   Years of education: Not on file   Highest education level: Not on file  Occupational History   Occupation: IT trainer, 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  Tobacco Use   Smoking status: Never   Smokeless tobacco: Never  Vaping Use   Vaping status: Never Used  Substance and Sexual Activity   Alcohol use: Not Currently    Comment: no h/o heavy use   Drug use: Not Currently    Types: Marijuana   Sexual activity: Not on file  Other Topics Concern   Not on file  Social  History Narrative   Not on file   Social Determinants of Health   Financial Resource Strain: Not on file  Food Insecurity: No Food Insecurity (01/27/2023)   Hunger Vital Sign    Worried About Running Out of Food in the Last Year: Never true    Ran Out of Food in the Last Year: Never true  Transportation Needs: No Transportation Needs (01/27/2023)   PRAPARE - Administrator, Civil Service (Medical): No    Lack of Transportation (Non-Medical): No  Physical Activity: Not on file  Stress: Not on file  Social Connections: Not on file  Intimate Partner Violence: Not At Risk (01/27/2023)   Humiliation, Afraid, Rape, and Kick questionnaire    Fear of Current or Ex-Partner: No    Emotionally Abused: No    Physically  Abused: No    Sexually Abused: No    Family History:  Family History  Problem Relation Age of Onset   Stroke Mother    Diabetes Father    Kidney disease Father    Stomach cancer Neg Hx    Colon cancer Neg Hx    Esophageal cancer Neg Hx    Pancreatic cancer Neg Hx     Medications:   Current Outpatient Medications on File Prior to Visit  Medication Sig Dispense Refill   amLODipine (NORVASC) 10 MG tablet Take 10 mg by mouth daily.     aspirin EC 81 MG tablet Take 1 tablet (81 mg total) by mouth daily. Swallow whole. 120 tablet 0   atorvastatin (LIPITOR) 40 MG tablet Take 1 tablet (40 mg total) by mouth daily. 30 tablet 0   Cholecalciferol (VITAMIN D-3 PO) Take 1 capsule by mouth daily.     ibuprofen (ADVIL) 200 MG tablet Take 400 mg by mouth daily as needed for moderate pain or headache.     Misc Natural Products (PROSTATE HEALTH PO) Take 1 tablet by mouth daily.     Multiple Vitamin (MULTIVITAMIN) tablet Take 1 tablet by mouth daily.     VITAMIN E PO Take 1 capsule by mouth daily.     No current facility-administered medications on file prior to visit.    Allergies:  No Known Allergies    OBJECTIVE:  Physical Exam  There were no vitals filed for this visit. There is no height or weight on file to calculate BMI. No results found.      No data to display           General: well developed, well nourished, seated, in no evident distress Head: head normocephalic and atraumatic.   Neck: supple with no carotid or supraclavicular bruits Cardiovascular: regular rate and rhythm, no murmurs Musculoskeletal: no deformity Skin:  no rash/petichiae Vascular:  Normal pulses all extremities   Neurologic Exam Mental Status: Awake and fully alert.  Fluent speech and language.  Oriented to place and time. Recent and remote memory intact. Attention span, concentration and fund of knowledge appropriate. Mood and affect appropriate.  Cranial Nerves: Fundoscopic exam reveals sharp  disc margins. Pupils equal, briskly reactive to light. Extraocular movements full without nystagmus. Visual fields full to confrontation. Hearing intact. Facial sensation intact. Face, tongue, palate moves normally and symmetrically.  Motor: Normal bulk and tone. Normal strength in all tested extremity muscles Sensory.: intact to touch , pinprick , position and vibratory sensation.  Coordination: Rapid alternating movements normal in all extremities. Finger-to-nose and heel-to-shin performed accurately bilaterally. Gait and Station: Arises from chair without difficulty. Stance is normal.  Gait demonstrates normal stride length and balance with ***. Tandem walk and heel toe ***.  Reflexes: 1+ and symmetric.    NIHSS  *** Modified Rankin  ***    ASSESSMENT: Keith Brewer is a 67 y.o. year old male presented on 01/27/2023 with left facial droop, clumsiness and slurred speech. Vascular risk factors include HTN, HLD, advanced age.      PLAN:  TIA: Likely right-sided TIA etiology likely small vessel disease: Residual deficit: none. Continue aspirin 81 mg daily  and atorvastatin 40mg  daily for secondary stroke prevention.  Discussed secondary stroke prevention measures and importance of close PCP follow up for aggressive stroke risk factor management. I have gone over the pathophysiology of stroke, warning signs and symptoms, risk factors and their management in some detail with instructions to go to the closest emergency room for symptoms of concern. HTN: BP goal <130/90.  Stable on amlodipine 10mg  per PCP HLD: LDL goal <70. Recent LDL 142. Continue atorvastatin 40mg  daily per PCP.  DMII: A1c goal<7.0. Recent A1c 5.9. prediabetes. Continue close monitoring with PCP. Advise low carb diet and regular exercise.    Follow up in *** or call earlier if needed   CC:  [facility] provider: Dr. Pearlean Brownie PCP: Kallie Locks, FNP    I spent *** minutes of face-to-face and non-face-to-face time with patient.   This included previsit chart review including review of recent hospitalization, lab review, study review, order entry, electronic health record documentation, patient education regarding recent stroke including etiology, secondary stroke prevention measures and importance of managing stroke risk factors, residual deficits and typical recovery time and answered all other questions to patient satisfaction   Keith Brewer, [facility]  [facility] [address] [place], [place] [address]  Phone [phone] Fax [phone] Note: This document was prepared with digital dictation and possible smart phrase technology. Any transcriptional errors that result from this process are unintentional.

## 2023-02-24 ENCOUNTER — Encounter: Payer: Self-pay | Admitting: Family Medicine

## 2023-02-24 ENCOUNTER — Inpatient Hospital Stay: Payer: Medicare PPO | Admitting: Family Medicine

## 2023-02-24 DIAGNOSIS — E785 Hyperlipidemia, unspecified: Secondary | ICD-10-CM

## 2023-02-24 DIAGNOSIS — I10 Essential (primary) hypertension: Secondary | ICD-10-CM

## 2023-02-24 DIAGNOSIS — R7303 Prediabetes: Secondary | ICD-10-CM

## 2023-02-24 DIAGNOSIS — G459 Transient cerebral ischemic attack, unspecified: Secondary | ICD-10-CM

## 2023-03-09 ENCOUNTER — Inpatient Hospital Stay: Payer: Medicare PPO | Admitting: Family Medicine

## 2023-03-12 ENCOUNTER — Inpatient Hospital Stay: Payer: Medicare PPO | Admitting: Neurology

## 2023-04-16 ENCOUNTER — Encounter: Payer: Self-pay | Admitting: Dietician

## 2023-04-16 ENCOUNTER — Encounter: Payer: Medicare PPO | Attending: Nurse Practitioner | Admitting: Dietician

## 2023-04-16 DIAGNOSIS — K219 Gastro-esophageal reflux disease without esophagitis: Secondary | ICD-10-CM | POA: Diagnosis not present

## 2023-04-16 DIAGNOSIS — G459 Transient cerebral ischemic attack, unspecified: Secondary | ICD-10-CM | POA: Insufficient documentation

## 2023-04-16 DIAGNOSIS — G4730 Sleep apnea, unspecified: Secondary | ICD-10-CM | POA: Insufficient documentation

## 2023-04-16 DIAGNOSIS — I10 Essential (primary) hypertension: Secondary | ICD-10-CM | POA: Insufficient documentation

## 2023-04-16 DIAGNOSIS — Z713 Dietary counseling and surveillance: Secondary | ICD-10-CM | POA: Insufficient documentation

## 2023-04-16 NOTE — Patient Instructions (Signed)
Cook dinner at home 3 nights per week (At meals, aim to include 1/2 plate non-starchy vegetables, 1/4 plate lean protein, and 1/4 plate complex carbs.)  If having a [place] sandwich add vegetables to the side.   Add beans or seeds to salad for protein.   Aim for 4 water bottles daily.   Stop eating at least 2 hours before bed (this includes snacks).   Continue your exercise routine!

## 2023-04-16 NOTE — Progress Notes (Signed)
Medical Nutrition Therapy  Appointment Start time:  1130  Appointment End time:  1220  Primary concerns today: nutrition for overall health, blood pressure, and cholesterol.    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 States his LDL went from 100 to 142 and he woke up one morning after the project and had a TIA and had to get hospitalized in July.   Pt states after this scare he made changes to his diet including stopping fried food, stopped eating bacon biscuits, eating more fruits and vegetables, and started walking 45 minutes daily 5 days per week. States before this he wasn't exercising.   Pt states he used to drink sodas but several years ago he was on watch for prostate cancer and made minor changes (stopped eating hamburgers, stopped sodas). Pt states when he was helping is brother he started drinking sodas again before July.   Pt states he thinks he has sleep apnea because he falls asleep in his chair and wakes up and feels like he has to start breathing again. He states he just had a sleep study done.   Pt states he doesn't eat until 11am.   Pt states he likes pecans and other nuts but it makes his throat feel swollen so he avoids them. He states no issues with seeds or peanut butter.   Estimated daily fluid intake: 32-48 oz Supplements: MVI, vitamin D, vitamin E Sleep: 6-7 hours, 2am-9am.  Stress / self-care:  low to moderate Current average weekly physical activity: walking 45 min 5x/wk  24-Hr Dietary Recall First Meal: 11am: fruit OR peanut butter sandwich on whole wheat Snack: none Second Meal: 2pm: [place] sandwich OR salad with veggies and fat free dressing Snack: sunflower seeds or pumpkin seeds Third Meal: chiptole (1-2x/wk) OR wendys/chickfila salad OR applebees shrimp bowl Snack: fruit Beverages: coffee, water with lemon, unsweet tea 32 oz   NUTRITION DIAGNOSIS  NB-1.1 Food and nutrition-related knowledge deficit As related to lack of prior nutrition education by a nutrition professional.  As evidenced by pt report.   NUTRITION INTERVENTION  Nutrition education (E-1) on the following topics:   MyPlate: Fruits & Vegetables: Aim to fill half your plate with a variety of fruits and vegetables. They are rich in vitamins, minerals, and fiber, and can help reduce the risk of chronic diseases. Choose a colorful assortment of fruits and vegetables to ensure you get a wide range of nutrients. Grains and Starches: Make at least half of your grain choices whole grains, such as brown rice, whole wheat bread, and oats. Whole grains provide fiber, which aids in digestion and healthy cholesterol levels. Aim for whole forms of starchy vegetables such as potatoes, sweet potatoes, beans, peas, and corn, which are fiber rich and provide many vitamins and minerals.  Protein: Incorporate lean sources of protein, such as poultry, fish, beans, nuts, and seeds, into your meals. Protein is essential for building and repairing tissues, staying full, balancing  blood sugar, as well as supporting immune function. Dairy: Include low-fat or fat-free dairy products like milk, yogurt, and cheese in your diet. Dairy foods are excellent sources of calcium and vitamin D, which are crucial for bone health.  Physical Activity: Aim for 60 minutes of physical activity daily. Regular physical activity promotes overall  health-including helping to reduce risk for heart disease and diabetes, promoting mental health, and helping [place] sleep better.   Fiber/LDL lowering nutrition therapy: Soluble fiber and impact on lowering cholesterol: Sources: Oats, barley, beans, lentils, fruits (apples, oranges), vegetables (carrots, broccoli), and flaxseeds. Benefits: Soluble fiber reduces the absorption of cholesterol into your bloodstream. Choose Healthy Unsaturated Fats and Omega 3's. Sources: Olive oil, canola oil, avocados, nuts, and Fatty fish (salmon, trout), walnuts, flaxseeds, and chia seeds. Benefits: Helps raise HDL cholesterol and lower LDL cholesterol. Omega-3s help reduce triglycerides and raise HDL cholesterol. Limit Saturated Fats: Sources: Red meat, full-fat dairy products, butter, and coconut oil. Benefits: Reducing intake helps lower LDL cholesterol levels. Eat Plenty of Fruits and Vegetables. Benefits: High in fiber and antioxidants, which help lower LDL cholesterol. Eat Whole Grains. Sources: Brown rice, whole wheat bread, quinoa, and whole grain pasta. Benefits: Whole grains contain more fiber and nutrients than refined grains.  Handouts Provided Include  MyPlate Planner  Learning Style & Readiness for Change Teaching method utilized: Visual & Auditory  Demonstrated degree of understanding via: Teach Back  Barriers to learning/adherence to lifestyle change: none  Goals Established by Pt  Cook dinner at home 3 nights per week (At meals, aim to include 1/2 plate non-starchy vegetables, 1/4 plate protein, and 1/4 plate complex carbs.)  If having a [place] sandwich add vegetables to the side.   Add beans or nuts to salad for protein.   Aim for 4 water bottles daily.   Stop eating at least 2 hours before bed (this includes snacks).   MONITORING & EVALUATION Dietary intake, weekly physical activity, and follow up as needed.  Next Steps  Patient is to call for questions.

## 2023-09-17 ENCOUNTER — Emergency Department (HOSPITAL_BASED_OUTPATIENT_CLINIC_OR_DEPARTMENT_OTHER)
Admission: EM | Admit: 2023-09-17 | Discharge: 2023-09-17 | Disposition: A | Discharge status: Home / Self Care | Payer: Medicare PPO | Attending: Emergency Medicine | Admitting: Emergency Medicine

## 2023-09-17 ENCOUNTER — Emergency Department (HOSPITAL_BASED_OUTPATIENT_CLINIC_OR_DEPARTMENT_OTHER): Payer: Medicare PPO

## 2023-09-17 ENCOUNTER — Other Ambulatory Visit: Payer: Self-pay

## 2023-09-17 ENCOUNTER — Encounter (HOSPITAL_BASED_OUTPATIENT_CLINIC_OR_DEPARTMENT_OTHER): Payer: Self-pay

## 2023-09-17 DIAGNOSIS — R0789 Other chest pain: Secondary | ICD-10-CM

## 2023-09-17 DIAGNOSIS — I10 Essential (primary) hypertension: Secondary | ICD-10-CM | POA: Diagnosis not present

## 2023-09-17 LAB — COMPREHENSIVE METABOLIC PANEL
ALT: 46 U/L — ABNORMAL HIGH (ref 0–44)
AST: 40 U/L (ref 15–41)
Albumin: 3.9 g/dL (ref 3.5–5.0)
Alkaline Phosphatase: 74 U/L (ref 38–126)
Anion gap: 9 (ref 5–15)
BUN: 13 mg/dL (ref 8–23)
CO2: 27 mmol/L (ref 22–32)
Calcium: 9.2 mg/dL (ref 8.9–10.3)
Chloride: 101 mmol/L (ref 98–111)
Creatinine, Ser: 1.04 mg/dL (ref 0.61–1.24)
GFR, Estimated: >60 mL/min (ref >60)
Glucose, Bld: 108 mg/dL — ABNORMAL HIGH (ref 70–99)
Potassium: 3.7 mmol/L (ref 3.5–5.1)
Sodium: 137 mmol/L (ref 135–145)
Total Bilirubin: 0.8 mg/dL (ref 0.0–1.2)
Total Protein: 6.7 g/dL (ref 6.5–8.1)

## 2023-09-17 LAB — CBC WITH DIFFERENTIAL/PLATELET
Abs Immature Granulocytes: 0 10*3/uL (ref 0.00–0.07)
Basophils Absolute: 0 10*3/uL (ref 0.0–0.1)
Basophils Relative: 0 %
Eosinophils Absolute: 0 10*3/uL (ref 0.0–0.5)
Eosinophils Relative: 1 %
HCT: 42.5 % (ref 39.0–52.0)
Hemoglobin: 14.9 g/dL (ref 13.0–17.0)
Immature Granulocytes: 0 %
Lymphocytes Relative: 47 %
Lymphs Abs: 1.7 10*3/uL (ref 0.7–4.0)
MCH: 30.2 pg (ref 26.0–34.0)
MCHC: 35.1 g/dL (ref 30.0–36.0)
MCV: 86.2 fL (ref 80.0–100.0)
Monocytes Absolute: 0.3 10*3/uL (ref 0.1–1.0)
Monocytes Relative: 8 %
Neutro Abs: 1.6 10*3/uL — ABNORMAL LOW (ref 1.7–7.7)
Neutrophils Relative %: 44 %
Platelets: 167 10*3/uL (ref 150–400)
RBC: 4.93 MIL/uL (ref 4.22–5.81)
RDW: 13.5 % (ref 11.5–15.5)
WBC: 3.6 10*3/uL — ABNORMAL LOW (ref 4.0–10.5)
nRBC: 0 % (ref 0.0–0.2)

## 2023-09-17 LAB — TROPONIN I (HIGH SENSITIVITY): Troponin I (High Sensitivity): 7 ng/L (ref <18)

## 2023-09-17 LAB — LIPASE, BLOOD: Lipase: 26 U/L (ref 11–51)

## 2023-09-17 NOTE — Discharge Instructions (Signed)

## 2023-09-17 NOTE — ED Provider Notes (Signed)
 Emergency Department Provider Note   I have reviewed the triage vital signs and the nursing notes.   HISTORY  Chief Complaint chest contraction   HPI Keith Brewer is a 68 y.o. male with past history reviewed below presents to the emergency department with chest discomfort.  He has had 2, momentary episodes in the past 48 hours.  He states in the setting of a stressful phone conversation he had an initial "contraction" pain in his chest.  This lasted for 1 to 2 seconds and then resolved.  No diaphoresis, nausea, shortness of breath.  No exertional symptoms.  He had return of similar pain today and so prompted ED visit.   Past Medical History:  Diagnosis Date   Colitis    Duodenal ulcer    Gastric ulcer    GERD (gastroesophageal reflux disease)    Hypertension    Internal hemorrhoids     Review of Systems  Constitutional: No fever/chills Cardiovascular: Positive chest pain. Respiratory: Denies shortness of breath. Gastrointestinal: No abdominal pain.  No nausea, no vomiting.  Musculoskeletal: Negative for back pain.  Skin: Negative for rash. Neurological: Negative for headaches.  ____________________________________________   PHYSICAL EXAM:  VITAL SIGNS: Vitals:   09/17/23 1900 09/17/23 1915  BP: (!) 141/74 115/70  Pulse: 60 (!) 56  Resp: 11 19  Temp:    SpO2: 100% 98%    Constitutional: Alert and oriented. Well appearing and in no acute distress. Eyes: Conjunctivae are normal.  Head: Atraumatic. Nose: No congestion/rhinnorhea. Mouth/Throat: Mucous membranes are moist. Neck: No stridor.   Cardiovascular: Normal rate, regular rhythm. Good peripheral circulation. Grossly normal heart sounds.   Respiratory: Normal respiratory effort.  No retractions. Lungs CTAB. Gastrointestinal: Soft and nontender. No distention.  Musculoskeletal: No gross deformities of extremities. Neurologic:  Normal speech and language.  Skin:  Skin is warm, dry and intact. No rash  noted.   ____________________________________________   LABS (all labs ordered are listed, but only abnormal results are displayed)  Labs Reviewed  COMPREHENSIVE METABOLIC PANEL - Abnormal; Notable for the following components:      Result Value   Glucose, Bld 108 (*)    ALT 46 (*)    All other components within normal limits  CBC WITH DIFFERENTIAL/PLATELET - Abnormal; Notable for the following components:   WBC 3.6 (*)    Neutro Abs 1.6 (*)    All other components within normal limits  LIPASE, BLOOD  TROPONIN I (HIGH SENSITIVITY)   ____________________________________________  EKG   EKG Interpretation Date/Time:  Thursday September 17 2023 18:10:56 EST Ventricular Rate:  57 PR Interval:  188 QRS Duration:  105 QT Interval:  422 QTC Calculation: 411 R Axis:   86  Text Interpretation: Sinus rhythm Borderline right axis deviation Borderline T abnormalities, inferior leads Similar to July 2024 tracing Confirmed by Alona Bene [phone]) on 09/17/2023 6:13:22 PM        ____________________________________________  RADIOLOGY  DG Chest 2 View Result Date: 09/17/2023 CLINICAL DATA:  Felt a contraction type pain within the chest EXAM: CHEST - 2 VIEW COMPARISON:  01/27/2023 FINDINGS: The heart size and mediastinal contours are within normal limits. Both lungs are clear. The visualized skeletal structures are unremarkable. IMPRESSION: No active cardiopulmonary disease. Electronically Signed   By: Sharlet Salina M.D.   On: 09/17/2023 20:13    ____________________________________________   PROCEDURES  Procedure(s) performed:   Procedures  None  ____________________________________________   INITIAL IMPRESSION / ASSESSMENT AND PLAN / ED COURSE  Pertinent labs &  imaging results that were available during my care of the patient were reviewed by me and considered in my medical decision making (see chart for details).   This patient is Presenting for Evaluation of CP,  which does require a range of treatment options, and is a complaint that involves a high risk of morbidity and mortality.  The Differential Diagnoses includes but is not exclusive to acute coronary syndrome, aortic dissection, pulmonary embolism, cardiac tamponade, community-acquired pneumonia, pericarditis, musculoskeletal chest wall pain, etc.  Clinical Laboratory Tests Ordered, included Troponin negative. CMP without AKI. LFTs normal. CBC without leukocytosis.   Radiologic Tests Ordered, included CXR. I independently interpreted the images and agree with radiology interpretation.   Cardiac Monitor Tracing which shows NSR.    Social Determinants of Health Risk patient is a non-smoker.   Medical Decision Making: Summary:  Patient presents to the emergency department for evaluation of 2 episodes of momentary chest discomfort.  Fairly atypical for ACS but given age plan for workup including chest x-ray and troponins.  No active discomfort at this time.  Reevaluation with update and discussion with patient. Labs and ACS workup are unremarkable. Plan for d/c with Cardiology follow up given age and risk factors.   Considered admission but ACS workup is reassuring.   Patient's presentation is most consistent with acute presentation with potential threat to life or bodily function.   Disposition: discharge  ____________________________________________  FINAL CLINICAL IMPRESSION(S) / ED DIAGNOSES  Final diagnoses:  Atypical chest pain    Note:  This document was prepared using Dragon voice recognition software and may include unintentional dictation errors.  Alona Bene, MD, [facility]    Aylin Rhoads, Arlyss Repress, MD 09/17/23 [phone]

## 2023-09-17 NOTE — ED Triage Notes (Signed)
 Pt states that yesterday he had a "chest contraction" around 11am after coming off of a stressful phone call, and then the same "chest contraction" happened again today when again coming off of a stressful phone call. These were both one-time occurrences with no lasting effects. Pt is not currently experiencing CP, SOB, or other cardiac symptoms.   Robina Ade, RN

## 2024-07-29 ENCOUNTER — Ambulatory Visit: Admitting: Internal Medicine

## 2024-07-29 ENCOUNTER — Other Ambulatory Visit: Payer: Self-pay

## 2024-07-29 ENCOUNTER — Encounter: Payer: Self-pay | Admitting: Internal Medicine

## 2024-07-29 VITALS — BP 112/68 | HR 68 | Temp 98.5°F | Resp 20 | Ht 70.0 in | Wt 184.0 lb

## 2024-07-29 DIAGNOSIS — J310 Chronic rhinitis: Secondary | ICD-10-CM | POA: Diagnosis not present

## 2024-07-29 DIAGNOSIS — R221 Localized swelling, mass and lump, neck: Secondary | ICD-10-CM | POA: Diagnosis not present

## 2024-07-29 DIAGNOSIS — R42 Dizziness and giddiness: Secondary | ICD-10-CM

## 2024-07-29 DIAGNOSIS — K219 Gastro-esophageal reflux disease without esophagitis: Secondary | ICD-10-CM

## 2024-07-29 NOTE — Patient Instructions (Addendum)
 Nut intolerance Reports throat swelling and difficulty breathing after consuming nuts, particularly walnuts, but only after a large amount.  He also describes waking up at night from throat swelling/choking from eating walnut. He has a history of acid reflux, unclear if controlled. Symptoms suggest a possible nut allergy , but differential includes acid reflux or pollen food allergy . Omeprazole has been effective in managing symptoms, indicating reflux may be a contributing factor. - Scheduled skin prick testing for nut allergy  on January 14th at 1:30 PM. - Advised strict avoidance of nuts until allergy  status is confirmed. - He is eating walnuts for health benefits, discussed other forms of health fats such as avocados, seeds, etc. - continue omeprazole daily  Suspected environmental allergy  Reports nasal symptoms including throat clearing, possibly due to environmental allergies or acid reflux. No prior allergy  testing conducted. - Scheduled skin prick testing for environmental allergies on January 14th at 1:30 PM. - Advised to avoid Claritin before the allergy  testing appointment.  Vertigo Main concern. Follows with ENT provider.  - recommend close follow-up with ENT  Follow up : Next Wednesday January 14th at 1:30 PM (1-55, [place], [place]) It was a pleasure meeting you in clinic today! Thank you for allowing me to participate in your care.  Rocky Endow, MD [facility]

## 2024-07-29 NOTE — Progress Notes (Signed)
 "  NEW PATIENT Date of Service/Encounter:   07/29/2024 Referring provider: Siganporia, Arnaz, FNP Primary care provider: Siganporia, Arnaz, FNP  Subjective:  Keith Brewer is a 69 y.o. male with a PMHx of ENT, prostate cancer, vertigo, TIA, GERD presenting today for evaluation of chronic rhinitis, vertigo and possible nut allergy  History obtained from: chart review and patient.   Discussed the use of AI scribe software for clinical note transcription with the patient, who gave verbal consent to proceed.  History of Present Illness Keith Brewer is a 69 year old male who presents for evaluation of potential allergies, including nut allergies. He was referred by an ENT specialist for evaluation of nasal symptoms and potential allergies.  Suspected nut allergy  - Experiences throat swelling and difficulty breathing after consuming nuts, particularly walnuts if eats too many but description of throat closing is more a gradual throat feels tighter and he falls asleep choking occasionally  - also  has poorly controlled reflux and sleeps on an incline - feels omeprazole helps him tolerate nuts better - Consumes walnuts in moderation. - No formal allergy  testing performed. - No history of other food allergies or medication allergies.  Nasal and throat symptoms - Persistent sore throat previously, now resolved. - Requires throat clearing every morning, possibly related to reflux or allergies. - No runny nose or nasal congestion. - No history of asthma or eczema.  Gastroesophageal reflux symptoms - Takes omeprazole once daily, finds it helpful for symptoms. - Advised to reduce caffeine and chocolate intake. - Recently purchased more chocolate.  Vertigo - Experiences vertigo, especially when water enters ears during hair washing. - Prescribed medication for vertigo but prefers not to take it. - Vertigo interferes with routine hair washing.  Chart Review:  Reviewed ENT notes from  referral 04/26/24: chronic rhinits-congestion and sneezing, use of afrin, hx of acid reflux, taking omeprazole 40 mg daily as needed, possible nut allergy  (sensation of throat closing) leading to avoidance of nuts; plan: neil med sinus finses, flonase and claritin, refer to allergy    Past Medical History: Past Medical History:  Diagnosis Date   Colitis    Duodenal ulcer    Gastric ulcer    GERD (gastroesophageal reflux disease)    Hypertension    Internal hemorrhoids    Medication List:  Current Outpatient Medications  Medication Sig Dispense Refill   amLODipine (NORVASC) 10 MG tablet Take 10 mg by mouth daily.     atorvastatin  (LIPITOR) 40 MG tablet Take 1 tablet (40 mg total) by mouth daily. 30 tablet 0   Azelastine HCl 137 MCG/SPRAY SOLN Place 1 spray into the nose.     Cholecalciferol (VITAMIN D-3 PO) Take 1 capsule by mouth daily.     fish oil-omega-3 fatty acids 1000 MG capsule Take by mouth.     ibuprofen (ADVIL) 200 MG tablet Take 400 mg by mouth daily as needed for moderate pain or headache.     levocetirizine (XYZAL) 5 MG tablet Take 5 mg by mouth.     meclizine (ANTIVERT) 25 MG tablet Take 25 mg by mouth.     Misc Natural Products (PROSTATE HEALTH PO) Take 1 tablet by mouth daily.     Multiple Vitamin (MULTIVITAMIN) tablet Take 1 tablet by mouth daily.     omeprazole (PRILOSEC) 40 MG capsule TAKE 1 CAPSULE(40 MG) BY MOUTH IN THE MORNING     triamcinolone cream (KENALOG) 0.1 % Apply topically.     VITAMIN E PO Take 1 capsule by mouth daily.  meloxicam (MOBIC) 15 MG tablet Take 15 mg by mouth daily. (Patient not taking: Reported on 07/29/2024)     No current facility-administered medications for this visit.   Known Allergies:  Allergies[1] Past Surgical History: Past Surgical History:  Procedure Laterality Date   EYE SURGERY     Family History: Family History  Problem Relation Age of Onset   Stroke Mother    Diabetes Father    Kidney disease Father    Stomach  cancer Neg Hx    Colon cancer Neg Hx    Esophageal cancer Neg Hx    Pancreatic cancer Neg Hx    COPD Neg Hx    Emphysema Neg Hx    Angioedema Neg Hx    Eczema Neg Hx    Allergic rhinitis Neg Hx    Lupus Neg Hx    Sinusitis Neg Hx    Thyroid  disease Neg Hx    Social History: [name] lives in a house built 70+ years ago, no water damage, carpet in the bedroom, gas heating, central AC, no pets, no roaches, using dust mite covers on the bed but not pillows, no smoke exposure.  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  No HEPA filter in the home.  [place]   ROS:  All other systems negative except as noted per HPI.  Objective:  Blood pressure 112/68, pulse 68, temperature 98.5 F (36.9 C), temperature source Oral, resp. rate 20, height 5' 10 (1.778 m), weight 184 lb (83.5 kg), SpO2 95%. Body mass index is 26.4 kg/m. Physical Exam:  General Appearance:  Alert, cooperative, no distress, appears stated age  Head:  Normocephalic, without obvious abnormality, atraumatic  Eyes:  Conjunctiva clear, EOM's intact  Ears EACs normal bilaterally and normal TMs bilaterally  Nose: Nares normal, hypertrophic turbinates, normal mucosa, and no visible anterior polyps  Throat: Lips, tongue normal; teeth and gums normal, normal posterior oropharynx  Neck: Supple, symmetrical  Lungs:   clear to auscultation bilaterally, Respirations unlabored, no coughing  Heart:  regular rate and rhythm and no murmur, Appears well perfused  Extremities: No edema  Skin: Skin color, texture, turgor normal and no rashes or lesions on visualized portions of skin  Neurologic: No gross deficits   Diagnostics:  Labs:  Lab Orders  No laboratory test(s) ordered today     Assessment and Plan  Assessment and Plan Assessment & Plan Nut intolerance Reports throat swelling and difficulty breathing after consuming nuts, particularly walnuts, but only after a large amount.   He also describes waking up at night from throat swelling/choking from eating walnut. He has a history of acid reflux, unclear if controlled. Symptoms suggest a possible nut allergy , but differential includes acid reflux or pollen food allergy . Omeprazole has been effective in managing symptoms, indicating reflux may be a contributing factor. - Scheduled skin prick testing for nut allergy  on January 14th at 1:30 PM. - Advised strict avoidance of nuts until allergy  status is confirmed. - He is eating walnuts for health benefits, discussed other forms of health fats such as avocados, seeds, etc. - continue omeprazole daily  Suspected environmental allergy  Reports nasal symptoms including throat clearing, possibly due to environmental allergies or acid reflux. No prior allergy  testing conducted. - Scheduled skin prick testing for environmental allergies on January 14th at 1:30 PM. - Advised to avoid Claritin before the allergy  testing appointment.  Vertigo Main concern. Follows with ENT provider.  - recommend close follow-up  with ENT  Follow up : Next Wednesday January 14th at 1:30 PM (1-55, [place], [place]) It was a pleasure meeting you in clinic today! Thank you for allowing me to participate in your care.  Rocky Endow, MD [facility]       This note in its entirety was forwarded to the Provider who requested this consultation.  Other: none  Thank you for your kind referral. I appreciate the opportunity to take part in Keith Brewer's care. Please do not hesitate to contact me with questions.  Sincerely,  Rocky Endow, MD [facility]          [1] No Known Allergies  "

## 2024-08-03 ENCOUNTER — Ambulatory Visit: Admitting: Internal Medicine

## 2024-08-03 ENCOUNTER — Encounter: Payer: Self-pay | Admitting: Internal Medicine

## 2024-08-03 DIAGNOSIS — J301 Allergic rhinitis due to pollen: Secondary | ICD-10-CM | POA: Diagnosis not present

## 2024-08-03 DIAGNOSIS — R221 Localized swelling, mass and lump, neck: Secondary | ICD-10-CM

## 2024-08-03 NOTE — Patient Instructions (Addendum)
 Nut intolerance Reports throat swelling and difficulty breathing after consuming nuts, particularly walnuts, but only after a large amount.  He also describes waking up at night from throat swelling/choking from eating walnut. He has a history of acid reflux, unclear if controlled. Symptoms suggest a possible nut allergy , but differential includes acid reflux or pollen food allergy . Omeprazole has been effective in managing symptoms, indicating reflux may be a contributing factor. - Skin prick testing for tree nuts and peanut 08/03/24: negative to peanut and all tree nuts with exception of pistachio which was borderline positive, will confirm with labs - Advised strict avoidance of nuts until allergy  status is confirmed. - He is eating walnuts for health benefits, discussed other forms of health fats such as avocados, seeds, etc. - continue omeprazole daily  Environmental allergies-allergic rhinitis Reports nasal symptoms including throat clearing, possibly due to environmental allergies or acid reflux. No prior allergy  testing conducted. - Skin testing to environmental allergies 08/03/24: positive to grass, ragweed and tree pollen; IDs positive to dust mites only, allergen avoidance.  -Consider allergy  injections to reduce lifetime symptoms and need for medications by teaching your immune system to become tolerant of the environmental allergens you are allergic to - continue Claritin 10 mg daily as needed - consider adding Nasacort 2 sprays daily  Vertigo Main concern. Follows with ENT provider.  - recommend close follow-up with ENT  Follow up : 6 months, sooner if needed It was a pleasure meeting you in clinic today! Thank you for allowing me to participate in your care.  Keith Endow, MD [facility]  Reducing Pollen Exposure  The American Academy of Allergy , Asthma and Immunology suggests the following steps to reduce your exposure to pollen during allergy  seasons.     Do not hang sheets or clothing out to dry; pollen may collect on these items. Do not mow lawns or spend time around freshly cut grass; mowing stirs up pollen. Keep windows closed at night.  Keep car windows closed while driving. Minimize morning activities outdoors, a time when pollen counts are usually at their highest. Stay indoors as much as possible when pollen counts or humidity is high and on windy days when pollen tends to remain in the air longer. Use air conditioning when possible.  Many air conditioners have filters that trap the pollen spores. Use a HEPA room air filter to remove pollen form the indoor air you breathe. DUST MITE AVOIDANCE MEASURES:  There are three main measures that need and can be taken to avoid house dust mites:  Reduce accumulation of dust in general -reduce furniture, clothing, carpeting, books, stuffed animals, especially in bedroom  Separate yourself from the dust -use pillow and mattress encasements (can be found at stores such as [employer] or online) -avoid direct exposure to air condition flow -use a HEPA filter device, especially in the bedroom; you can also use a HEPA filter vacuum cleaner -wipe dust with a moist towel instead of a dry towel or broom when cleaning  Decrease mites and/or their secretions -wash clothing and linen and stuffed animals at highest temperature possible, at least every 2 weeks -stuffed animals can also be placed in a bag and put in a freezer overnight  Despite the above measures, it is impossible to eliminate dust mites or their allergen completely from your home.  With the above measures the burden of mites in your home can be diminished, with the goal of minimizing your allergic symptoms.  Success will be reached only when implementing and using all means together.

## 2024-08-03 NOTE — Progress Notes (Addendum)
" °  Date of Service/Encounter:  08/03/2024  Allergy  testing appointment   Initial visit on 07/29/24, seen for chronic rhinitis.  Please see that note for additional details.  Today reports for allergy  diagnostic testing:    DIAGNOSTICS:  Skin Testing: Environmental allergy  panel and select foods. Adequate positive and negative controls. Results discussed with patient/family.  Airborne Adult Perc - 08/03/24 1422     1. Control-Buffer 50% Glycerol --          Intradermal - 08/03/24 1510     Time Antigen Placed 1510    Allergen Manufacturer Other    Location Arm    Number of Test 10    Control Negative    Weed Mix Negative    Mold 1 Negative    Mold 2 Negative    Mold 3 Negative    Mold 4 Negative    Mite Mix 3+    Cat Negative    Dog Negative    Cockroach Negative          Food Adult Perc - 08/03/24 1400     Time Antigen Placed 0130    Allergen Manufacturer Jestine    Location Back    Number of allergen test 8    1. Peanut Negative    10. Cashew Negative    11. Walnut Food Negative    12. Almond Negative    13. Hazelnut Negative    14. Pecan Food Negative    15. Pistachio 3+    16. Brazil Nut Negative          Allergy  testing results were read and interpreted by myself, documented by clinical staff.  Patient provided with copy of allergy  testing along with avoidance measures when indicated.   Rocky Endow, MD  [facility]    -------------------------------------------- Nut intolerance Reports throat swelling and difficulty breathing after consuming nuts, particularly walnuts, but only after a large amount.  He also describes waking up at night from throat swelling/choking from eating walnut. He has a history of acid reflux, unclear if controlled. Symptoms suggest a possible nut allergy , but differential includes acid reflux or pollen food allergy . Omeprazole has been effective in managing symptoms, indicating reflux may be a  contributing factor. - Skin prick testing for tree nuts and peanut 08/03/24: negative to peanut and all tree nuts with exception of pistachio which was borderline positive, will confirm with labs - Advised strict avoidance of nuts until allergy  status is confirmed. - He is eating walnuts for health benefits, discussed other forms of health fats such as avocados, seeds, etc. - continue omeprazole daily  Environmental allergies-allergic rhinitis Reports nasal symptoms including throat clearing, possibly due to environmental allergies or acid reflux. No prior allergy  testing conducted. - Skin testing to environmental allergies 08/03/24: positive to grass, ragweed and tree pollen; IDs positive to dust mites only, allergen avoidance.  -Consider allergy  injections to reduce lifetime symptoms and need for medications by teaching your immune system to become tolerant of the environmental allergens you are allergic to - continue Claritin 10 mg daily as needed - consider adding Nasacort 2 sprays daily  Vertigo Main concern. Follows with ENT provider.  - recommend close follow-up with ENT  Follow up : 6 months, sooner if needed It was a pleasure meeting you in clinic today! Thank you for allowing me to participate in your care.  Rocky Endow, MD [facility]    "

## 2025-02-01 ENCOUNTER — Ambulatory Visit: Admitting: Internal Medicine
